# Patient Record
Sex: Male | Born: 1935 | Race: Black or African American | Hispanic: No | Marital: Married | Smoking: Former smoker
Health system: Southern US, Community
[De-identification: ages and names within clinical notes are randomized; demographics above are authoritative.]

## PROBLEM LIST (undated history)

## (undated) DIAGNOSIS — I10 Essential (primary) hypertension: Secondary | ICD-10-CM

## (undated) DIAGNOSIS — I442 Atrioventricular block, complete: Secondary | ICD-10-CM

## (undated) HISTORY — DX: Atrioventricular block, complete: I44.2

## (undated) HISTORY — PX: FRACTURE SURGERY: SHX138

---

## 2009-10-10 ENCOUNTER — Ambulatory Visit: Payer: Self-pay | Admitting: Diagnostic Radiology

## 2009-10-10 ENCOUNTER — Emergency Department (HOSPITAL_BASED_OUTPATIENT_CLINIC_OR_DEPARTMENT_OTHER): Admission: EM | Admit: 2009-10-10 | Discharge: 2009-10-10 | Payer: Self-pay | Admitting: Emergency Medicine

## 2010-09-28 LAB — URINALYSIS, ROUTINE W REFLEX MICROSCOPIC
Glucose, UA: NEGATIVE mg/dL
Nitrite: NEGATIVE
Protein, ur: 100 mg/dL — AB
pH: 5 (ref 5.0–8.0)

## 2010-09-28 LAB — DIFFERENTIAL
Basophils Relative: 0 % (ref 0–1)
Eosinophils Relative: 0 % (ref 0–5)
Lymphocytes Relative: 7 % — ABNORMAL LOW (ref 12–46)
Lymphs Abs: 0.4 10*3/uL — ABNORMAL LOW (ref 0.7–4.0)
Monocytes Relative: 4 % (ref 3–12)
Neutro Abs: 6.1 10*3/uL (ref 1.7–7.7)
Neutrophils Relative %: 89 % — ABNORMAL HIGH (ref 43–77)

## 2010-09-28 LAB — URINE MICROSCOPIC-ADD ON

## 2010-09-28 LAB — COMPREHENSIVE METABOLIC PANEL
ALT: 44 U/L (ref 0–53)
Alkaline Phosphatase: 115 U/L (ref 39–117)
CO2: 26 mEq/L (ref 19–32)
GFR calc Af Amer: 25 mL/min — ABNORMAL LOW (ref >60)
GFR calc non Af Amer: 21 mL/min — ABNORMAL LOW (ref >60)
Potassium: 3.9 mEq/L (ref 3.5–5.1)
Total Protein: 7.4 g/dL (ref 6.0–8.3)

## 2010-09-28 LAB — PROTIME-INR: Prothrombin Time: 14.7 seconds (ref 11.6–15.2)

## 2010-09-28 LAB — POCT I-STAT 3, ART BLOOD GAS (G3+)
Bicarbonate: 23.8 mEq/L (ref 20.0–24.0)
TCO2: 25 mmol/L (ref 0–100)

## 2010-09-28 LAB — URINE CULTURE: Colony Count: 40000

## 2010-09-28 LAB — CBC
HCT: 33.4 % — ABNORMAL LOW (ref 39.0–52.0)
Hemoglobin: 11.3 g/dL — ABNORMAL LOW (ref 13.0–17.0)
MCV: 88.8 fL (ref 78.0–100.0)
WBC: 6.7 10*3/uL (ref 4.0–10.5)

## 2010-09-28 LAB — CULTURE, BLOOD (ROUTINE X 2)

## 2010-09-28 LAB — POCT CARDIAC MARKERS

## 2010-09-28 LAB — D-DIMER, QUANTITATIVE: D-Dimer, Quant: 2.26 ug/mL-FEU — ABNORMAL HIGH (ref 0.00–0.48)

## 2018-04-15 ENCOUNTER — Inpatient Hospital Stay (HOSPITAL_BASED_OUTPATIENT_CLINIC_OR_DEPARTMENT_OTHER)
Admission: EM | Admit: 2018-04-15 | Discharge: 2018-04-17 | DRG: 982 | Disposition: A | Discharge status: Home / Self Care | Payer: Medicare Other | Attending: Cardiology | Admitting: Cardiology

## 2018-04-15 ENCOUNTER — Other Ambulatory Visit: Payer: Self-pay

## 2018-04-15 ENCOUNTER — Encounter (HOSPITAL_BASED_OUTPATIENT_CLINIC_OR_DEPARTMENT_OTHER): Payer: Self-pay | Admitting: *Deleted

## 2018-04-15 ENCOUNTER — Emergency Department (HOSPITAL_BASED_OUTPATIENT_CLINIC_OR_DEPARTMENT_OTHER): Payer: Medicare Other

## 2018-04-15 DIAGNOSIS — R079 Chest pain, unspecified: Secondary | ICD-10-CM | POA: Diagnosis present

## 2018-04-15 DIAGNOSIS — Z6831 Body mass index (BMI) 31.0-31.9, adult: Secondary | ICD-10-CM

## 2018-04-15 DIAGNOSIS — E1122 Type 2 diabetes mellitus with diabetic chronic kidney disease: Secondary | ICD-10-CM | POA: Diagnosis present

## 2018-04-15 DIAGNOSIS — Z23 Encounter for immunization: Secondary | ICD-10-CM

## 2018-04-15 DIAGNOSIS — I442 Atrioventricular block, complete: Secondary | ICD-10-CM | POA: Diagnosis present

## 2018-04-15 DIAGNOSIS — E669 Obesity, unspecified: Secondary | ICD-10-CM | POA: Diagnosis present

## 2018-04-15 DIAGNOSIS — M109 Gout, unspecified: Secondary | ICD-10-CM | POA: Diagnosis present

## 2018-04-15 DIAGNOSIS — N183 Chronic kidney disease, stage 3 (moderate): Secondary | ICD-10-CM | POA: Diagnosis present

## 2018-04-15 DIAGNOSIS — E1151 Type 2 diabetes mellitus with diabetic peripheral angiopathy without gangrene: Secondary | ICD-10-CM | POA: Diagnosis present

## 2018-04-15 DIAGNOSIS — I340 Nonrheumatic mitral (valve) insufficiency: Secondary | ICD-10-CM | POA: Diagnosis present

## 2018-04-15 DIAGNOSIS — I10 Essential (primary) hypertension: Secondary | ICD-10-CM

## 2018-04-15 DIAGNOSIS — E785 Hyperlipidemia, unspecified: Secondary | ICD-10-CM | POA: Diagnosis present

## 2018-04-15 DIAGNOSIS — M1990 Unspecified osteoarthritis, unspecified site: Secondary | ICD-10-CM | POA: Diagnosis present

## 2018-04-15 DIAGNOSIS — I4510 Unspecified right bundle-branch block: Secondary | ICD-10-CM | POA: Diagnosis present

## 2018-04-15 DIAGNOSIS — Z959 Presence of cardiac and vascular implant and graft, unspecified: Secondary | ICD-10-CM

## 2018-04-15 DIAGNOSIS — I129 Hypertensive chronic kidney disease with stage 1 through stage 4 chronic kidney disease, or unspecified chronic kidney disease: Secondary | ICD-10-CM | POA: Diagnosis present

## 2018-04-15 DIAGNOSIS — Z7982 Long term (current) use of aspirin: Secondary | ICD-10-CM

## 2018-04-15 HISTORY — DX: Essential (primary) hypertension: I10

## 2018-04-15 LAB — TROPONIN I: Troponin I: <0.03 ng/mL (ref <0.03)

## 2018-04-15 LAB — CBC WITH DIFFERENTIAL/PLATELET
BASOS PCT: 1 %
Basophils Absolute: 0 10*3/uL (ref 0.0–0.1)
Eosinophils Absolute: 0.4 10*3/uL (ref 0.0–0.7)
Eosinophils Relative: 8 %
HEMATOCRIT: 37.3 % — AB (ref 39.0–52.0)
HEMOGLOBIN: 12.6 g/dL — AB (ref 13.0–17.0)
Lymphocytes Relative: 36 %
Lymphs Abs: 1.9 10*3/uL (ref 0.7–4.0)
MCH: 29.6 pg (ref 26.0–34.0)
MCHC: 33.8 g/dL (ref 30.0–36.0)
MCV: 87.6 fL (ref 78.0–100.0)
MONOS PCT: 10 %
Monocytes Absolute: 0.5 10*3/uL (ref 0.1–1.0)
NEUTROS ABS: 2.4 10*3/uL (ref 1.7–7.7)
NEUTROS PCT: 45 %
Platelets: 246 10*3/uL (ref 150–400)
RBC: 4.26 MIL/uL (ref 4.22–5.81)
RDW: 14.3 % (ref 11.5–15.5)
WBC: 5.4 10*3/uL (ref 4.0–10.5)

## 2018-04-15 LAB — CBC
HCT: 36.9 % — ABNORMAL LOW (ref 39.0–52.0)
Hemoglobin: 12.1 g/dL — ABNORMAL LOW (ref 13.0–17.0)
MCH: 29.7 pg (ref 26.0–34.0)
MCHC: 32.8 g/dL (ref 30.0–36.0)
MCV: 90.7 fL (ref 78.0–100.0)
PLATELETS: 228 10*3/uL (ref 150–400)
RBC: 4.07 MIL/uL — AB (ref 4.22–5.81)
RDW: 13.9 % (ref 11.5–15.5)
WBC: 5.4 10*3/uL (ref 4.0–10.5)

## 2018-04-15 LAB — COMPREHENSIVE METABOLIC PANEL
ALBUMIN: 4 g/dL (ref 3.5–5.0)
ALT: 21 U/L (ref 0–44)
AST: 29 U/L (ref 15–41)
Alkaline Phosphatase: 56 U/L (ref 38–126)
Anion gap: 8 (ref 5–15)
BILIRUBIN TOTAL: 0.7 mg/dL (ref 0.3–1.2)
BUN: 21 mg/dL (ref 8–23)
CO2: 25 mmol/L (ref 22–32)
Calcium: 9.3 mg/dL (ref 8.9–10.3)
Chloride: 104 mmol/L (ref 98–111)
Creatinine, Ser: 1.62 mg/dL — ABNORMAL HIGH (ref 0.61–1.24)
GFR calc Af Amer: 44 mL/min — ABNORMAL LOW (ref >60)
GFR calc non Af Amer: 38 mL/min — ABNORMAL LOW (ref >60)
GLUCOSE: 109 mg/dL — AB (ref 70–99)
Potassium: 3.8 mmol/L (ref 3.5–5.1)
Sodium: 137 mmol/L (ref 135–145)
TOTAL PROTEIN: 7.8 g/dL (ref 6.5–8.1)

## 2018-04-15 LAB — MAGNESIUM: Magnesium: 1.7 mg/dL (ref 1.7–2.4)

## 2018-04-15 MED ORDER — HYDRALAZINE HCL 20 MG/ML IJ SOLN: 10.0000 mg | Freq: Four times a day (QID) | INTRAMUSCULAR | Status: DC | PRN

## 2018-04-15 MED ORDER — HEPARIN SODIUM (PORCINE) 5000 UNIT/ML IJ SOLN
5000.0000 [IU] | Freq: Three times a day (TID) | INTRAMUSCULAR | Status: DC
  Administered 2018-04-15 – 2018-04-16 (×2): 5000 [IU] via SUBCUTANEOUS
  Filled 2018-04-15 (×2): qty 1

## 2018-04-15 MED ORDER — AMLODIPINE BESYLATE 5 MG PO TABS
5.0000 mg | ORAL_TABLET | Freq: Every day | ORAL | Status: DC
  Administered 2018-04-15: 5 mg via ORAL
  Filled 2018-04-15: qty 1

## 2018-04-15 MED ORDER — ASPIRIN 81 MG PO CHEW
81.0000 mg | CHEWABLE_TABLET | Freq: Every day | ORAL | Status: DC
  Administered 2018-04-16: 81 mg via ORAL
  Filled 2018-04-15: qty 1

## 2018-04-15 MED ORDER — INFLUENZA VAC SPLIT HIGH-DOSE 0.5 ML IM SUSY
0.5000 mL | PREFILLED_SYRINGE | INTRAMUSCULAR | Status: AC
  Administered 2018-04-16: 0.5 mL via INTRAMUSCULAR
  Filled 2018-04-15: qty 0.5

## 2018-04-15 NOTE — ED Provider Notes (Signed)
[facility] EMERGENCY DEPARTMENT Provider Note   CSN: [id number] Arrival date & time: 04/15/18  1455     History   Chief Complaint Chief Complaint  Patient presents with  . Chest Pain    HPI Alexander Oconnor is a 84 y.o. male.  Patient is an 84 year old male with hx of HTN and is currently wearing a heart monitor because he states his doctor thought he may have an abnormal rhythm.  Patient states he has had a normal day today.  He was outside working in the yard when approximately 20 minutes ago he was called by his doctor's office at [facility] cardiology and told to go to the hospital immediately because the monitor was showing an abnormal heart rhythm and concern for blockage.  Patient has no complaints at this time.  He does not feel weak, near syncopal, chest pain, shortness of breath, tired, abdominal pain, nausea or vomiting.  He states is been a normal day.  He is currently taking his medications as prescribed.  He takes aspirin but no other anticoagulant.  He did recently stop a medication on Friday when he had the monitor placed but that is the only recent medication change.  He cannot remember the name of the medication.  The history is provided by the patient and the spouse.    Past Medical History:  Diagnosis Date  . Hypertension     There are no active problems to display for this patient.   History reviewed. No pertinent surgical history.      Home Medications    Prior to Admission medications   Medication Sig Start Date End Date Taking? Authorizing Provider  aspirin 81 MG tablet Take 81 mg by mouth daily.   Yes [provider]  Carvedilol (COREG PO) Take by mouth.   Yes [provider]  MELOXICAM PO Take by mouth.   Yes [provider]  naproxen sodium (ALEVE) 220 MG tablet Take 220 mg by mouth.   Yes [provider]  SPIRONOLACTONE PO Take by mouth.   Yes [provider]    Family History No family  history on file.  Social History Social History   Tobacco Use  . Smoking status: Not on file  Substance Use Topics  . Alcohol use: Not on file  . Drug use: Not on file     Allergies   Patient has no known allergies.   Review of Systems Review of Systems  All other systems reviewed and are negative.    Physical Exam Updated Vital Signs BP (!) 188/64   Pulse 65   Temp 97.8 F (36.6 C) (Oral)   Resp 20   Ht 5\' 11"  (1.803 m)   Wt 79.4 kg   SpO2 100%   BMI 24.41 kg/m   Physical Exam  Constitutional: He is oriented to person, place, and time. He appears well-developed and well-nourished. No distress.  HENT:  Head: Normocephalic and atraumatic.  Mouth/Throat: Oropharynx is clear and moist.  Eyes: Pupils are equal, round, and reactive to light. Conjunctivae and EOM are normal.  Neck: Normal range of motion. Neck supple.  Cardiovascular: Regular rhythm and intact distal pulses. Bradycardia present.  No murmur heard. Pulmonary/Chest: Effort normal and breath sounds normal. No respiratory distress. He has no wheezes. He has no rales.  Abdominal: Soft. He exhibits no distension. There is no tenderness. There is no rebound and no guarding.  Musculoskeletal: Normal range of motion. He exhibits no edema or tenderness.  Neurological: He is alert and oriented to person, place, and time.  Skin: Skin is warm and dry. No rash noted. No erythema.  Psychiatric: He has a normal mood and affect. His behavior is normal.  Nursing note and vitals reviewed.    ED Treatments / Results  Labs (all labs ordered are listed, but only abnormal results are displayed) Labs Reviewed  CBC WITH DIFFERENTIAL/PLATELET - Abnormal; Notable for the following components:      Result Value   Hemoglobin 12.6 (*)    HCT 37.3 (*)    All other components within normal limits  COMPREHENSIVE METABOLIC PANEL - Abnormal; Notable for the following components:   Glucose, Bld 109 (*)    Creatinine, Ser 1.62  (*)    GFR calc non Af Amer 38 (*)    GFR calc Af Amer 44 (*)    All other components within normal limits  TROPONIN I  MAGNESIUM    EKG EKG Interpretation  Date/Time:  Monday April 15 2018 15:20:32 EDT Ventricular Rate:  41 PR Interval:    QRS Duration: 152 QT Interval:  509 QTC Calculation: 421 R Axis:   85 Text Interpretation:  new  Third degree heart block Right bundle branch block LVH by voltage Inferior infarct, old Lateral leads are also involved Confirmed by Gwyneth Sprout ([id number]) on 04/15/2018 3:58:10 PM   Radiology Dg Chest 2 View  Result Date: 04/15/2018 CLINICAL DATA:  Chest pain EXAM: CHEST - 2 VIEW COMPARISON:  Chest x-ray of 4 levin 2011 FINDINGS: No active infiltrate or effusion is seen. There is slight elevation of both hemidiaphragms. Mild atelectasis is noted at the lung bases as result. Mediastinal and hilar contours are stable and moderate cardiomegaly is stable. No acute bony abnormality is seen. IMPRESSION: Stable chest x-ray with no active lung disease. Stable moderate cardiomegaly. Electronically Signed   By: Dwyane Dee M.D.   On: 04/15/2018 16:07    Procedures Procedures (including critical care time)  Medications Ordered in ED Medications - No data to display   Initial Impression / Assessment and Plan / ED Course  I have reviewed the triage vital signs and the nursing notes.  Pertinent labs & imaging results that were available during my care of the patient were reviewed by me and considered in my medical decision making (see chart for details).     84 year old male coming in today because he was called by his cardiology office stating that he needed to come to the hospital immediately.  Patient states that he had an heart monitor placed on Friday because he felt like he was having an irregular heartbeat.  On Friday he did have an episode and he hit the button and he has not had any episodes since that time.  Today he was working in his yard  and is felt completely normal.  However when speaking with the cardiology office they reviewed his strip today from his event on Friday and it showed a possible complete heart block and they recommended he come for further evaluation.  3:59 PM His EKG today is concerning for third-degree heart block with a heart rate most consistently in the 40s.  Spoke with Dr. Hanley Hays who requested patient go to [facility] for pacemaker.  Labs are pending  4:25 PM Labs without acute findings and CXR with cardiomegaly but nothing acute.  CRITICAL CARE Performed by: Kenyon Eichelberger Total critical care time: 30 minutes Critical care time was exclusive of separately billable procedures and treating other patients.  Critical care was necessary to treat or prevent imminent or life-threatening deterioration. Critical care was time spent personally by me on the following activities: development of treatment plan with patient and/or surrogate as well as nursing, discussions with consultants, evaluation of patient's response to treatment, examination of patient, obtaining history from patient or surrogate, ordering and performing treatments and interventions, ordering and review of laboratory studies, ordering and review of radiographic studies, pulse oximetry and re-evaluation of patient's condition.   Final Clinical Impressions(s) / ED Diagnoses   Final diagnoses:  Third degree heart block [facility])    ED Discharge Orders    None       Gwyneth Sprout, MD 04/15/18 1642

## 2018-04-15 NOTE — H&P (Signed)
Cardiology Admission History and Physical:   Patient ID: Alexander Oconnor MRN: [id number]; DOB: Dec 16, 1933   Admission date: 04/15/2018  Primary Care Provider: Patient, No Pcp Per Primary Cardiologist: New to [facility] Primary Electrophysiologist:  None   Chief Complaint:  arrhythmia  Patient Profile:   Alexander Oconnor is a 84 y.o. male with PMH of HTN, carotid artery disease, PVD, and mitral regurgitation, who presented to [facility] after being instructed to present to the ED for an arrhythmia on his heart monitor.  History of Present Illness:   Alexander Oconnor was in his usual state of health this afternoon, working in his yard, when he received a call from his cardiology office at [facility] to notify him of an abnormal heart rhythm on his event monitor and to present to the ED immediately for further evaluation. He was recently seen outpatient 03/15/18 and his EKG reportedly revealed AVB with 2:1 conduction. His labetalol was decreased at that time and he was recommended for a 30d event monitor. He has not had any complaints of chest pain, weakness, dizziness, lightheadedness, syncope, SOB, nausea, vomiting, or fatigue.    ED course: HR down to the 30s, BP significantly elevated to 205/80, otherwise VSS. Labs notable for electrolytes wnl, Cr 1.62, Hgb 12.6, PLT 246, troponin negative. CXR without acute findings. EKG on arrival to [facility] revealed complete heart block. Patient was transferred to [facility] for EP evaluation and likely PPM placement.    Past Medical History:  Diagnosis Date  . Hypertension     History reviewed. No pertinent surgical history.   Medications Prior to Admission: Prior to Admission medications   Medication Sig Start Date End Date Taking? Authorizing Provider  aspirin 81 MG tablet Take 81 mg by mouth daily.   Yes [provider]  Carvedilol (COREG PO) Take by mouth.   Yes [provider]  MELOXICAM PO Take by mouth.   Yes [provider]  naproxen sodium (ALEVE) 220 MG tablet Take 220 mg by mouth.   Yes [provider]  SPIRONOLACTONE PO Take by mouth.   Yes [provider]     Allergies:   No Known Allergies  Social History:   Social History   Socioeconomic History  . Marital status: Married    Spouse name: Not on file  . Number of children: Not on file  . Years of education: Not on file  . Highest education level: Not on file  Occupational History  . Not on file  Social Needs  . Financial resource strain: Not on file  . Food insecurity:    Worry: Not on file    Inability: Not on file  . Transportation needs:    Medical: Not on file    Non-medical: Not on file  Tobacco Use  . Smoking status: Not on file  Substance and Sexual Activity  . Alcohol use: Not on file  . Drug use: Not on file  . Sexual activity: Not on file  Lifestyle  . Physical activity:    Days per week: Not on file    Minutes per session: Not on file  . Stress: Not on file  Relationships  . Social connections:    Talks on phone: Not on file    Gets together: Not on file    Attends religious service: Not on file    Active member of club or organization: Not on file    Attends meetings of clubs or organizations: Not on file  Relationship status: Not on file  . Intimate partner violence:    Fear of current or ex partner: Not on file    Emotionally abused: Not on file    Physically abused: Not on file    Forced sexual activity: Not on file  Other Topics Concern  . Not on file  Social History Narrative  . Not on file    Family History:  Reports his parents passed away from old age. Denies family history of heart problems.  The patient's family history is negative for Heart disease.    ROS:  Please see the history of present illness.  All other ROS reviewed and negative.     Physical Exam/Data:   Vitals:   04/15/18 1530 04/15/18 1706 04/15/18 1921 04/15/18 1947  BP: (!) 181/69 (!) 178/80 (!) 205/80     Pulse: (!) 42 (!) 36 (!) 37 73  Resp: 14 18 19 15   Temp:   97.7 F (36.5 C)   TempSrc:   Oral   SpO2: 100% 99% 100% 99%  Weight:   101.6 kg   Height:       No intake or output data in the 24 hours ending 04/15/18 2049 Filed Weights   04/15/18 1502 04/15/18 1921  Weight: 79.4 kg 101.6 kg   Body mass index is 31.23 kg/m.  General:  Well nourished, well developed, laying in bed in no acute distress HEENT: sclera anicteric Neck: no JVD Endocrine:  No thryomegaly Vascular: No carotid bruits; distal pulses 2+ bilaterally Cardiac:  normal S1, S2; RRR; soft murmur, no rubs/gallops  Lungs:  Decreased breath sounds throughout, no wheezes/rales/rhonchi Abd: soft, obese, nontender, no hepatomegaly  Ext: no edema Musculoskeletal:  No deformities, BUE and BLE strength normal and equal Skin: warm and dry  Neuro:  CNs 2-12 intact, no focal abnormalities noted Psych:  Normal affect    EKG:  EKG on arrival to [facility] revealed complete heart block with RBBB (chronic)  Relevant CV Studies: None available for review. Last echo reportedly 03/15/18 "stable" per cardiologist.   Laboratory Data:  Chemistry Recent Labs  Lab 04/15/18 1535  NA 137  K 3.8  CL 104  CO2 25  GLUCOSE 109*  BUN 21  CREATININE 1.62*  CALCIUM 9.3  GFRNONAA 38*  GFRAA 44*  ANIONGAP 8    Recent Labs  Lab 04/15/18 1535  PROT 7.8  ALBUMIN 4.0  AST 29  ALT 21  ALKPHOS 56  BILITOT 0.7   Hematology Recent Labs  Lab 04/15/18 1535  WBC 5.4  RBC 4.26  HGB 12.6*  HCT 37.3*  MCV 87.6  MCH 29.6  MCHC 33.8  RDW 14.3  PLT 246   Cardiac Enzymes Recent Labs  Lab 04/15/18 1535  TROPONINI <0.03   No results for input(s): TROPIPOC in the last 168 hours.  BNPNo results for input(s): BNP, PROBNP in the last 168 hours.  DDimer No results for input(s): DDIMER in the last 168 hours.  Radiology/Studies:  Dg Chest 2 View  Result Date: 04/15/2018 CLINICAL DATA:  Chest pain EXAM: CHEST - 2 VIEW COMPARISON:   Chest x-ray of 4 levin 2011 FINDINGS: No active infiltrate or effusion is seen. There is slight elevation of both hemidiaphragms. Mild atelectasis is noted at the lung bases as result. Mediastinal and hilar contours are stable and moderate cardiomegaly is stable. No acute bony abnormality is seen. IMPRESSION: Stable chest x-ray with no active lung disease. Stable moderate cardiomegaly. Electronically Signed   By: Dwyane Dee  M.D.   On: 04/15/2018 16:07    Assessment and Plan:   1. CHB: noted on event monitor outpatient. He was instructed to present to the ED for further evaluation. He is asymptomatic. HR in the 30s. EKG shows complete heart block. BP markedly elevated. Last dose of carvedilol was this morning.  - Will hold carvedilol at this time.  - Will check an echocardiogram - Will check a TSH - EP to see in AM - Continue to monitor closely on telemetry and maintain pacer pads as a precaution.   2. HTN: BP poorly controlled.  - Will start amlodipine 5mg  daily - Continue spironolactone once dose confirmed by pharmacy  3. CKD stage 3: Cr 1.6 on admission; presumably this is his baseline. Per cardiology note, he follows with a nephrologist - Continue close monitoring of Cr  4. Carotid artery disease: follows with his outpatient cardiologist - Recommended for annual monitoring.   5. Mitral regurgitation: asymptomatic. Followed with routine outpatient echocardiograms - Continue routine outpatient monitoring.    Severity of Illness: The appropriate patient status for this patient is INPATIENT. Inpatient status is judged to be reasonable and necessary in order to provide the required intensity of service to ensure the patient's safety. The patient's presenting symptoms, physical exam findings, and initial radiographic and laboratory data in the context of their chronic comorbidities is felt to place them at high risk for further clinical deterioration. Furthermore, it is not anticipated that  the patient will be medically stable for discharge from the hospital within 2 midnights of admission. The following factors support the patient status of inpatient.   " The patient's presenting symptoms include asymptomatic. " The worrisome physical exam findings include benign cardiopulmonary exam. " The initial radiographic and laboratory data are worrisome because of CHB on EKG. " The chronic co-morbidities include HTN, carotid artery disease, CKD stage 3.   * I certify that at the point of admission it is my clinical judgment that the patient will require inpatient hospital care spanning beyond 2 midnights from the point of admission due to high intensity of service, high risk for further deterioration and high frequency of surveillance required.*    For questions or updates, please contact [facility] Please consult [email or web address] for contact info under        Signed, Beatriz Stallion, PA-C  04/15/2018 8:49 PM

## 2018-04-15 NOTE — ED Notes (Signed)
Called Dr. Hanley Hays at [facility]

## 2018-04-15 NOTE — Progress Notes (Signed)
Skin assessment done by this nurse and Laney Pastor. On head to toe assessment no pressure ulcer noted. Patient came in with personal heart monitor which was given to his wife to take home.

## 2018-04-15 NOTE — ED Triage Notes (Signed)
He is wearing a heart monitor. The office called him at home and told him to come to the ED. Wife thinks they told her he had a blockage. Pt is pain free.

## 2018-04-15 NOTE — ED Notes (Signed)
Elevated SBP (216)  addressed with EDP.  No new order at this time.  Emergency planning/management officer, Tray, is made aware that I had a conversation with EDP.  PAtient is en route to [facility] via [employer].  Wife at bedside during this transfer.

## 2018-04-16 ENCOUNTER — Encounter (HOSPITAL_COMMUNITY)
Admission: EM | Disposition: A | Discharge status: Home / Self Care | Payer: Self-pay | Source: Home / Self Care | Attending: Cardiology

## 2018-04-16 ENCOUNTER — Inpatient Hospital Stay (HOSPITAL_COMMUNITY): Payer: Medicare Other

## 2018-04-16 DIAGNOSIS — I340 Nonrheumatic mitral (valve) insufficiency: Secondary | ICD-10-CM

## 2018-04-16 DIAGNOSIS — I442 Atrioventricular block, complete: Secondary | ICD-10-CM

## 2018-04-16 HISTORY — PX: PACEMAKER IMPLANT: EP1218

## 2018-04-16 LAB — BASIC METABOLIC PANEL
Anion gap: 5 (ref 5–15)
BUN: 17 mg/dL (ref 8–23)
CO2: 26 mmol/L (ref 22–32)
Calcium: 9.1 mg/dL (ref 8.9–10.3)
Chloride: 105 mmol/L (ref 98–111)
Creatinine, Ser: 1.57 mg/dL — ABNORMAL HIGH (ref 0.61–1.24)
GFR calc Af Amer: 46 mL/min — ABNORMAL LOW (ref >60)
GFR calc non Af Amer: 40 mL/min — ABNORMAL LOW (ref >60)
Glucose, Bld: 104 mg/dL — ABNORMAL HIGH (ref 70–99)
Potassium: 3.9 mmol/L (ref 3.5–5.1)
Sodium: 136 mmol/L (ref 135–145)

## 2018-04-16 LAB — ECHOCARDIOGRAM COMPLETE
HEIGHTINCHES: 71 in
Weight: 3582.4 oz

## 2018-04-16 LAB — SURGICAL PCR SCREEN
MRSA, PCR: NEGATIVE
Staphylococcus aureus: NEGATIVE

## 2018-04-16 LAB — TSH: TSH: 1.862 u[IU]/mL (ref 0.350–4.500)

## 2018-04-16 SURGERY — PACEMAKER IMPLANT
Anesthesia: LOCAL

## 2018-04-16 MED ORDER — CEFAZOLIN SODIUM-DEXTROSE 1-4 GM/50ML-% IV SOLN
1.0000 g | Freq: Four times a day (QID) | INTRAVENOUS | Status: AC
  Administered 2018-04-16 – 2018-04-17 (×3): 1 g via INTRAVENOUS
  Filled 2018-04-16 (×3): qty 50

## 2018-04-16 MED ORDER — ONDANSETRON HCL 4 MG/2ML IJ SOLN: 4.0000 mg | Freq: Four times a day (QID) | INTRAMUSCULAR | Status: DC | PRN

## 2018-04-16 MED ORDER — IOPAMIDOL (ISOVUE-370) INJECTION 76%
INTRAVENOUS | Status: DC | PRN
  Administered 2018-04-16: 15 mL via INTRAVENOUS

## 2018-04-16 MED ORDER — CEFAZOLIN SODIUM-DEXTROSE 2-4 GM/100ML-% IV SOLN
INTRAVENOUS | Status: AC
  Filled 2018-04-16: qty 100

## 2018-04-16 MED ORDER — CEFAZOLIN SODIUM-DEXTROSE 2-4 GM/100ML-% IV SOLN
2.0000 g | INTRAVENOUS | Status: AC
  Administered 2018-04-16: 2 g via INTRAVENOUS
  Filled 2018-04-16: qty 100

## 2018-04-16 MED ORDER — SODIUM CHLORIDE 0.9% FLUSH: 3.0000 mL | INTRAVENOUS | Status: DC | PRN

## 2018-04-16 MED ORDER — SODIUM CHLORIDE 0.9 % IV SOLN: 250.0000 mL | INTRAVENOUS | Status: DC | PRN

## 2018-04-16 MED ORDER — SODIUM CHLORIDE 0.9 % IV SOLN
80.0000 mg | INTRAVENOUS | Status: AC
  Administered 2018-04-16: 80 mg
  Filled 2018-04-16: qty 2

## 2018-04-16 MED ORDER — CHLORHEXIDINE GLUCONATE 4 % EX LIQD
60.0000 mL | Freq: Once | CUTANEOUS | Status: AC
  Administered 2018-04-16: 4 via TOPICAL

## 2018-04-16 MED ORDER — IOPAMIDOL (ISOVUE-370) INJECTION 76%
INTRAVENOUS | Status: AC
  Filled 2018-04-16: qty 50

## 2018-04-16 MED ORDER — SODIUM CHLORIDE 0.9% FLUSH
3.0000 mL | Freq: Two times a day (BID) | INTRAVENOUS | Status: DC
  Administered 2018-04-16: 3 mL via INTRAVENOUS

## 2018-04-16 MED ORDER — HEPARIN (PORCINE) IN NACL 1000-0.9 UT/500ML-% IV SOLN
INTRAVENOUS | Status: AC
  Filled 2018-04-16: qty 500

## 2018-04-16 MED ORDER — ACETAMINOPHEN 325 MG PO TABS: 325.0000 mg | ORAL_TABLET | ORAL | Status: DC | PRN

## 2018-04-16 MED ORDER — LIDOCAINE HCL (PF) 1 % IJ SOLN
INTRAMUSCULAR | Status: DC | PRN
  Administered 2018-04-16: 45 mL

## 2018-04-16 MED ORDER — SODIUM CHLORIDE 0.9 % IV SOLN
INTRAVENOUS | Status: DC
  Administered 2018-04-16: 13:00:00 via INTRAVENOUS

## 2018-04-16 MED ORDER — SODIUM CHLORIDE 0.9 % IV SOLN
INTRAVENOUS | Status: AC
  Filled 2018-04-16: qty 2

## 2018-04-16 MED ORDER — HYDRALAZINE HCL 20 MG/ML IJ SOLN
INTRAMUSCULAR | Status: AC
  Filled 2018-04-16: qty 1

## 2018-04-16 MED ORDER — FINASTERIDE 5 MG PO TABS
5.0000 mg | ORAL_TABLET | Freq: Every day | ORAL | Status: DC
  Administered 2018-04-16 – 2018-04-17 (×2): 5 mg via ORAL
  Filled 2018-04-16 (×2): qty 1

## 2018-04-16 MED ORDER — SPIRONOLACTONE 25 MG PO TABS
50.0000 mg | ORAL_TABLET | Freq: Every day | ORAL | Status: DC
  Administered 2018-04-16: 50 mg via ORAL
  Filled 2018-04-16: qty 2

## 2018-04-16 MED ORDER — CHLORHEXIDINE GLUCONATE 4 % EX LIQD
60.0000 mL | Freq: Once | CUTANEOUS | Status: DC
  Filled 2018-04-16: qty 60

## 2018-04-16 MED ORDER — HYDROCODONE-ACETAMINOPHEN 5-325 MG PO TABS
1.0000 | ORAL_TABLET | ORAL | Status: DC | PRN
  Administered 2018-04-16: 2 via ORAL
  Filled 2018-04-16: qty 2

## 2018-04-16 MED ORDER — HEPARIN (PORCINE) IN NACL 1000-0.9 UT/500ML-% IV SOLN
INTRAVENOUS | Status: DC | PRN
  Administered 2018-04-16: 500 mL

## 2018-04-16 MED ORDER — LIDOCAINE HCL 1 % IJ SOLN
INTRAMUSCULAR | Status: AC
  Filled 2018-04-16: qty 60

## 2018-04-16 MED ORDER — SODIUM CHLORIDE 0.9 % IV SOLN: 250.0000 mL | INTRAVENOUS | Status: DC

## 2018-04-16 MED ORDER — HYDRALAZINE HCL 20 MG/ML IJ SOLN
INTRAMUSCULAR | Status: DC | PRN
  Administered 2018-04-16: 10 mg via INTRAVENOUS

## 2018-04-16 MED ORDER — LABETALOL HCL 100 MG PO TABS
100.0000 mg | ORAL_TABLET | Freq: Two times a day (BID) | ORAL | Status: DC
  Administered 2018-04-16 – 2018-04-17 (×2): 100 mg via ORAL
  Filled 2018-04-16 (×2): qty 1

## 2018-04-16 SURGICAL SUPPLY — 7 items
CABLE SURGICAL S-101-97-12 (CABLE) ×2 IMPLANT
LEAD TENDRIL MRI 52CM LPA1200M (Lead) ×2 IMPLANT
LEAD TENDRIL MRI 58CM LPA1200M (Lead) ×2 IMPLANT
PACEMAKER ASSURITY DR-RF (Pacemaker) ×2 IMPLANT
PAD PRO RADIOLUCENT 2001M-C (PAD) ×2 IMPLANT
SHEATH CLASSIC 8F (SHEATH) ×4 IMPLANT
TRAY PACEMAKER INSERTION (PACKS) ×2 IMPLANT

## 2018-04-16 NOTE — Progress Notes (Signed)
  Echocardiogram 2D Echocardiogram has been performed.  Alexander Oconnor 04/16/2018, 11:07 AM

## 2018-04-16 NOTE — Interval H&P Note (Signed)
History and Physical Interval Note:  04/16/2018 1:48 PM  Alexander Oconnor  has presented today for surgery, with the diagnosis of Complete Heart Block  The various methods of treatment have been discussed with the patient and family. After consideration of risks, benefits and other options for treatment, the patient has consented to  Procedure(s): PACEMAKER IMPLANT (N/A) as a surgical intervention .  The patient's history has been reviewed, patient examined, no change in status, stable for surgery.  I have reviewed the patient's chart and labs.  Questions were answered to the patient's satisfaction.     Hillis Range

## 2018-04-16 NOTE — Consult Note (Addendum)
Cardiology Consultation:   Patient ID: Alexander Oconnor MRN: [id number]; DOB: [birth year]  Admit date: 04/15/2018 Date of Consult: 04/16/2018  Primary Care Provider: Patient, No Pcp Per Primary Cardiologist: Dr. Hanley Hays [facility] cardiology) Primary Electrophysiologist:  None    Patient Profile:   Alexander Oconnor is a 84 y.o. 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 Mayford Knife.  History of Present Illness:   Alexander Oconnor was seen by his cardiologist at [facility], he went for his test results (Echo, carotid US, LEA/ABI study).  Reports of those studies are not included in record/note, though mention echo was "stable", recs follow up studies 1 year for all.  It appears that day 03/15/18, he was found in 2:1 AVBlock, w/RBBB, 68bpm and asymptomatic.  Labetalol was reduced to 100mg  BID (from 200mg  BID) and planned for monitoring and f/u.  He was referred to the ER yesterday by that office for telemetry reports of CHB.  He was seen at [facility], noted to be in advanced heart block, hypertensive 205/80 and transferred to [facility] last evening for further care and management.  LABS today K+ 3.9 BUN/Creat 17/1.57 Mag (yesterday) 1.7 WBC 5.4 H/H 12/36 Plts 228  Patient confirms he was on labetalol, his dose reduced at his visit with his doctor last month, and this past weekend was called by his office Saturday 04/13/18 to stop it because his heart rate was still too slow.  His last dose of labetalol was 04/13/18 morning.  He feels well, states he was working in his yard all weekend, no CP, palpitations, no exertional intolerances.  No dizziness, near syncope or syncope are reported.   Past Medical History:  Diagnosis Date  . Hypertension     History reviewed. No pertinent surgical history.    Inpatient Medications: Scheduled Meds: . aspirin  81 mg Oral Daily  .  heparin  5,000 Units Subcutaneous Q8H  . Influenza vac split quadrivalent PF  0.5 mL Intramuscular Tomorrow-1000   Continuous Infusions:  PRN Meds: hydrALAZINE  Allergies:   No Known Allergies  Social History:   Social History   Socioeconomic History  . Marital status: Married    Spouse name: Not on file  . Number of children: Not on file  . Years of education: Not on file  . Highest education level: Not on file  Occupational History  . Not on file  Social Needs  . Financial resource strain: Not on file  . Food insecurity:    Worry: Not on file    Inability: Not on file  . Transportation needs:    Medical: Not on file    Non-medical: Not on file  Tobacco Use  . Smoking status: Not on file  Substance and Sexual Activity  . Alcohol use: Not on file  . Drug use: Not on file  . Sexual activity: Not on file  Lifestyle  . Physical activity:    Days per week: Not on file    Minutes per session: Not on file  . Stress: Not on file  Relationships  . Social connections:    Talks on phone: Not on file    Gets together: Not on file    Attends religious service: Not on file    Active member of club or organization: Not on file    Attends meetings of clubs or  organizations: Not on file    Relationship status: Not on file  . Intimate partner violence:    Fear of current or ex partner: Not on file    Emotionally abused: Not on file    Physically abused: Not on file    Forced sexual activity: Not on file  Other Topics Concern  . Not on file  Social History Narrative  . Not on file    Family History:   Family History  Problem Relation Age of Onset  . Heart disease Neg Hx      ROS:  Please see the history of present illness.  All other ROS reviewed and negative.     Physical Exam/Data:   Vitals:   04/16/18 0334 04/16/18 0404 04/16/18 0507 04/16/18 0753  BP: (!) 152/53 (!) 167/70 (!) 164/71 (!) 144/72  Pulse: (!) 35 (!) 44  (!) 34  Resp: 15 14 19 12   Temp:  97.7  F (36.5 C)  97.8 F (36.6 C)  TempSrc:  Oral  Oral  SpO2: 95% 100%  99%  Weight:      Height:        Intake/Output Summary (Last 24 hours) at 04/16/2018 0848 Last data filed at 04/16/2018 0756 Gross per 24 hour  Intake 240 ml  Output 430 ml  Net -190 ml   Filed Weights   04/15/18 1502 04/15/18 1921  Weight: 79.4 kg 101.6 kg   Body mass index is 31.23 kg/m.  General:  Well nourished, well developed, in no acute distress HEENT: normal Lymph: no adenopathy Neck: no JVD Endocrine:  No thryomegaly Vascular: No carotid bruits Cardiac:  RRR; bradycardic, no murmurs, gallops or rubs Lungs:  CTA b/l, no wheezing, rhonchi or rales  Abd: soft, nontender  Ext: no edema Musculoskeletal:  No deformities Skin: warm and dry  Neuro:  no focal abnormalities noted Psych:  Normal affect   EKG:  The EKG was personally reviewed and demonstrates:   SR, Mobitz I with some 2:1 conduction, RBBB, V rate 41 Telemetry:  Telemetry was personally reviewed and demonstrates:   Generally is CHB, V rates 30's  Relevant CV Studies:  Out patient cards not makes referance to a recent echo, report has been requested to be sent stat  Laboratory Data:  Chemistry Recent Labs  Lab 04/15/18 1535 04/15/18 2338  NA 137 136  K 3.8 3.9  CL 104 105  CO2 25 26  GLUCOSE 109* 104*  BUN 21 17  CREATININE 1.62* 1.57*  CALCIUM 9.3 9.1  GFRNONAA 38* 40*  GFRAA 44* 46*  ANIONGAP 8 5    Recent Labs  Lab 04/15/18 1535  PROT 7.8  ALBUMIN 4.0  AST 29  ALT 21  ALKPHOS 56  BILITOT 0.7   Hematology Recent Labs  Lab 04/15/18 1535 04/15/18 2338  WBC 5.4 5.4  RBC 4.26 4.07*  HGB 12.6* 12.1*  HCT 37.3* 36.9*  MCV 87.6 90.7  MCH 29.6 29.7  MCHC 33.8 32.8  RDW 14.3 13.9  PLT 246 228   Cardiac Enzymes Recent Labs  Lab 04/15/18 1535  TROPONINI <0.03   No results for input(s): TROPIPOC in the last 168 hours.  BNPNo results for input(s): BNP, PROBNP in the last 168 hours.  DDimer No results for  input(s): DDIMER in the last 168 hours.  Radiology/Studies:   Dg Chest 2 View Result Date: 04/15/2018 CLINICAL DATA:  Chest pain EXAM: CHEST - 2 VIEW COMPARISON:  Chest x-ray of 4 levin 2011 FINDINGS: No active  infiltrate or effusion is seen. There is slight elevation of both hemidiaphragms. Mild atelectasis is noted at the lung bases as result. Mediastinal and hilar contours are stable and moderate cardiomegaly is stable. No acute bony abnormality is seen. IMPRESSION: Stable chest x-ray with no active lung disease. Stable moderate cardiomegaly. Electronically Signed   By: Dwyane Dee M.D.   On: 04/15/2018 16:07    Assessment and Plan:   1. CHB this AM     Off his labetalol >48hours, persistent CHB this AM     Recommend PPM  I discussed indication for PPM, the potential benefits and risks of the procedure.  The patient states understanding and would like to proceed. I have spoken with medical records at Dr. Dyke Brackett office, she will send his echo from last month Dr. Johney Frame will see later this AM  2. HTN     Resume BB post implant   For questions or updates, please contact [facility] Please consult [email or web address] for contact info under     Signed, Sheilah Pigeon, PA-C  04/16/2018 8:48 AM   I have seen, examined the patient, and reviewed the above assessment and plan.  Changes to above are made where necessary.  On exam, bradycardic regular rhythm.  Pt with complete heart block and slow escape.  No reversible causes have been found.  I would therefore recommend pacemaker implantation at this time.  Risks, benefits, alternatives to pacemaker implantation were discussed in detail with the patient today. The patient understands that the risks include but are not limited to bleeding, infection, pneumothorax, perforation, tamponade, vascular damage, renal failure, MI, stroke, death,  and lead dislodgement and wishes to proceed. We will therefore schedule the procedure at the next  available time.    Co Sign: Hillis Range, MD 04/16/2018 1:46 PM

## 2018-04-16 NOTE — H&P (View-Only) (Signed)
Cardiology Consultation:   Patient ID: Alexander Oconnor MRN: [id number]; DOB: 02/16/1934  Admit date: 04/15/2018 Date of Consult: 04/16/2018  Primary Care Provider: Patient, No Pcp Per Primary Cardiologist: Alexander Oconnor ([facility]) Primary Electrophysiologist:  None    Patient Profile:   Alexander Oconnor is a 83 y.o. 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  History of Present Illness:   Alexander Oconnor was seen by his cardiologist at [facility], he went for his test results (Echo, carotid US, LEA/ABI study).  Reports of those studies are not included in record/note, though mention echo was "stable", recs follow up studies 1 year for all.  It appears that day 03/15/18, he was found in 2:1 AVBlock, w/RBBB, 68bpm and asymptomatic.  Labetalol was reduced to 100mg BID (from 200mg BID) and planned for monitoring and f/u.  He was referred to the ER yesterday by that office for telemetry reports of CHB.  He was seen at [facility], noted to be in advanced heart block, hypertensive 205/80 and transferred to [facility] last evening for further care and management.  LABS today K+ 3.9 BUN/Creat 17/1.57 Mag (yesterday) 1.7 WBC 5.4 H/H 12/36 Plts 228  Patient confirms he was on labetalol, his dose reduced at his visit with his doctor last month, and this past weekend was called by his office Saturday 04/13/18 to stop it because his heart rate was still too slow.  His last dose of labetalol was 04/13/18 morning.  He feels well, states he was working in his yard all weekend, no CP, palpitations, no exertional intolerances.  No dizziness, near syncope or syncope are reported.   Past Medical History:  Diagnosis Date  . Hypertension     History reviewed. No pertinent surgical history.    Inpatient Medications: Scheduled Meds: . aspirin  81 mg Oral Daily  .  heparin  5,000 Units Subcutaneous Q8H  . Influenza vac split quadrivalent PF  0.5 mL Intramuscular Tomorrow-1000   Continuous Infusions:  PRN Meds: hydrALAZINE  Allergies:   No Known Allergies  Social History:   Social History   Socioeconomic History  . Marital status: Married    Spouse name: Not on file  . Number of children: Not on file  . Years of education: Not on file  . Highest education level: Not on file  Occupational History  . Not on file  Social Needs  . Financial resource strain: Not on file  . Food insecurity:    Worry: Not on file    Inability: Not on file  . Transportation needs:    Medical: Not on file    Non-medical: Not on file  Tobacco Use  . Smoking status: Not on file  Substance and Sexual Activity  . Alcohol use: Not on file  . Drug use: Not on file  . Sexual activity: Not on file  Lifestyle  . Physical activity:    Days per week: Not on file    Minutes per session: Not on file  . Stress: Not on file  Relationships  . Social connections:    Talks on phone: Not on file    Gets together: Not on file    Attends religious service: Not on file    Active member of club or organization: Not on file    Attends meetings of clubs or   organizations: Not on file    Relationship status: Not on file  . Intimate partner violence:    Fear of current or ex partner: Not on file    Emotionally abused: Not on file    Physically abused: Not on file    Forced sexual activity: Not on file  Other Topics Concern  . Not on file  Social History Narrative  . Not on file    Family History:   Family History  Problem Relation Age of Onset  . Heart disease Neg Hx      ROS:  Please see the history of present illness.  All other ROS reviewed and negative.     Physical Exam/Data:   Vitals:   04/16/18 0334 04/16/18 0404 04/16/18 0507 04/16/18 0753  BP: (!) 152/53 (!) 167/70 (!) 164/71 (!) 144/72  Pulse: (!) 35 (!) 44  (!) 34  Resp: 15 14 19 12  Temp:  97.7  F (36.5 C)  97.8 F (36.6 C)  TempSrc:  Oral  Oral  SpO2: 95% 100%  99%  Weight:      Height:        Intake/Output Summary (Last 24 hours) at 04/16/2018 0848 Last data filed at 04/16/2018 0756 Gross per 24 hour  Intake 240 ml  Output 430 ml  Net -190 ml   Filed Weights   04/15/18 1502 04/15/18 1921  Weight: 79.4 kg 101.6 kg   Body mass index is 31.23 kg/m.  General:  Well nourished, well developed, in no acute distress HEENT: normal Lymph: no adenopathy Neck: no JVD Endocrine:  No thryomegaly Vascular: No carotid bruits Cardiac:  RRR; bradycardic, no murmurs, gallops or rubs Lungs:  CTA b/l, no wheezing, rhonchi or rales  Abd: soft, nontender  Ext: no edema Musculoskeletal:  No deformities Skin: warm and dry  Neuro:  no focal abnormalities noted Psych:  Normal affect   EKG:  The EKG was personally reviewed and demonstrates:   SR, Mobitz I with some 2:1 conduction, RBBB, V rate 41 Telemetry:  Telemetry was personally reviewed and demonstrates:   Generally is CHB, V rates 30's  Relevant CV Studies:  Out patient cards not makes referance to a recent echo, report has been requested to be sent stat  Laboratory Data:  Chemistry Recent Labs  Lab 04/15/18 1535 04/15/18 2338  NA 137 136  K 3.8 3.9  CL 104 105  CO2 25 26  GLUCOSE 109* 104*  BUN 21 17  CREATININE 1.62* 1.57*  CALCIUM 9.3 9.1  GFRNONAA 38* 40*  GFRAA 44* 46*  ANIONGAP 8 5    Recent Labs  Lab 04/15/18 1535  PROT 7.8  ALBUMIN 4.0  AST 29  ALT 21  ALKPHOS 56  BILITOT 0.7   Hematology Recent Labs  Lab 04/15/18 1535 04/15/18 2338  WBC 5.4 5.4  RBC 4.26 4.07*  HGB 12.6* 12.1*  HCT 37.3* 36.9*  MCV 87.6 90.7  MCH 29.6 29.7  MCHC 33.8 32.8  RDW 14.3 13.9  PLT 246 228   Cardiac Enzymes Recent Labs  Lab 04/15/18 1535  TROPONINI <0.03   No results for input(s): TROPIPOC in the last 168 hours.  BNPNo results for input(s): BNP, PROBNP in the last 168 hours.  DDimer No results for  input(s): DDIMER in the last 168 hours.  Radiology/Studies:   Dg Chest 2 View Result Date: 04/15/2018 CLINICAL DATA:  Chest pain EXAM: CHEST - 2 VIEW COMPARISON:  Chest x-ray of 4 levin 2011 FINDINGS: No active   infiltrate or effusion is seen. There is slight elevation of both hemidiaphragms. Mild atelectasis is noted at the lung bases as result. Mediastinal and hilar contours are stable and moderate cardiomegaly is stable. No acute bony abnormality is seen. IMPRESSION: Stable chest x-ray with no active lung disease. Stable moderate cardiomegaly. Electronically Signed   By: Paul  Barry M.D.   On: 04/15/2018 16:07    Assessment and Plan:   1. CHB this AM     Off his labetalol >48hours, persistent CHB this AM     Recommend PPM  I discussed indication for PPM, the potential benefits and risks of the procedure.  The patient states understanding and would like to proceed. I have spoken with medical records at Alexander Oconnor's office, she will send his echo from last month Dr. Jeryl Wilbourn will see later this AM  2. HTN     Resume BB post implant   For questions or updates, please contact [facility] Please consult [email or web address] for contact info under     Signed, Renee Lynn Ursuy, PA-C  04/16/2018 8:48 AM   I have seen, examined the patient, and reviewed the above assessment and plan.  Changes to above are made where necessary.  On exam, bradycardic regular rhythm.  Pt with complete heart block and slow escape.  No reversible causes have been found.  I would therefore recommend pacemaker implantation at this time.  Risks, benefits, alternatives to pacemaker implantation were discussed in detail with the patient today. The patient understands that the risks include but are not limited to bleeding, infection, pneumothorax, perforation, tamponade, vascular damage, renal failure, MI, stroke, death,  and lead dislodgement and wishes to proceed. We will therefore schedule the procedure at the next  available time.    Co Sign: Bennette Hasty, MD 04/16/2018 1:46 PM   

## 2018-04-16 NOTE — Plan of Care (Signed)
  Problem: Education: Goal: Knowledge of General Education information will improve Description Including pain rating scale, medication(s)/side effects and non-pharmacologic comfort measures Outcome: Completed/Met   Problem: Clinical Measurements: Goal: Respiratory complications will improve Outcome: Completed/Met   Problem: Activity: Goal: Risk for activity intolerance will decrease Outcome: Completed/Met   Problem: Nutrition: Goal: Adequate nutrition will be maintained Outcome: Completed/Met

## 2018-04-17 ENCOUNTER — Inpatient Hospital Stay (HOSPITAL_COMMUNITY): Payer: Medicare Other

## 2018-04-17 ENCOUNTER — Encounter (HOSPITAL_COMMUNITY): Payer: Self-pay | Admitting: Internal Medicine

## 2018-04-17 LAB — BASIC METABOLIC PANEL
ANION GAP: 7 (ref 5–15)
BUN: 17 mg/dL (ref 8–23)
CO2: 23 mmol/L (ref 22–32)
Calcium: 9 mg/dL (ref 8.9–10.3)
Chloride: 104 mmol/L (ref 98–111)
Creatinine, Ser: 1.49 mg/dL — ABNORMAL HIGH (ref 0.61–1.24)
GFR calc Af Amer: 49 mL/min — ABNORMAL LOW (ref >60)
GFR calc non Af Amer: 42 mL/min — ABNORMAL LOW (ref >60)
GLUCOSE: 131 mg/dL — AB (ref 70–99)
Potassium: 4.4 mmol/L (ref 3.5–5.1)
Sodium: 134 mmol/L — ABNORMAL LOW (ref 135–145)

## 2018-04-17 MED FILL — Lidocaine HCl Local Inj 1%: INTRAMUSCULAR | Qty: 60 | Status: AC

## 2018-04-17 NOTE — Discharge Instructions (Signed)
Biventricular Pacemaker Implantation A biventricular pacemaker implantation is a procedure to place (implant) a pacemaker into both of the lower chambers (ventricles) of the heart. A pacemaker is a small, battery-powered device that helps control the heartbeat. If the heart beats irregularly or too slowly (bradycardia), the pacemaker will pace the heart so that it beats at a normal rate or a programmed rate. The parts of a biventricular pacemaker include:  The pulse generator. The pulse generator contains a small computer and a memory system that is programmed to keep the heart beating at a certain rate. The pulse generator also produces the electrical signal that triggers the heart to beat. This is implanted under the skin of the upper chest, near the collarbone.  Wires (leads). The leads are placed in the left and right ventricles of the heart. The leads are connected to the pulse generator. They transmit electrical pulses from the pulse generator to the heart.  This procedure may be done to treat:  Bradycardia.  Symptoms of severe heart failure, such as shortness of breath (dyspnea).  Loss of consciousness that happens repeatedly (syncope) because of an irregular heart rate.  Tell a health care provider about:  Any allergies you have.  All medicines you are taking, including vitamins, herbs, eye drops, creams, and over-the-counter medicines.  Any problems you or family members have had with anesthetic medicines.  Any blood disorders you have.  Any surgeries you have had.  Any medical conditions you have.  Whether you are pregnant or may be pregnant. What are the risks? Generally, this is a safe procedure. However, problems may occur, including:  Infection.  Bleeding.  Allergic reactions to medicines or dyes.  Damage to other structures or organs, such as your blood vessels, lungs, or heart.  Failure of the pacemaker to improve your condition.  What happens before the  procedure?  Ask your health care provider about: ? Changing or stopping your regular medicines. This is especially important if you are taking diabetes medicines or blood thinners. ? Taking medicines such as aspirin and ibuprofen. These medicines can thin your blood. Do not take these medicines before your procedure if your health care provider instructs you not to.  Follow instructions from your health care provider about eating or drinking restrictions.  Do not use any tobacco products for at least 24 hours before your procedure. This includes cigarettes, chewing tobacco, or e-cigarettes.  Ask your health care provider how your surgical site will be marked or identified.  You may be given antibiotic medicine to help prevent infection.  You may have tests, including: ? Blood tests. ? Chest X-rays.  Plan to have someone take you home after the procedure.  If you go home right after the procedure, plan to have someone with you for 24 hours. What happens during the procedure?  To reduce your risk of infection: ? Your health care team will wash or sanitize their hands. ? Your skin will be washed with soap. ? Hair may be removed from your surgical area.  An IV tube will be inserted into one of your veins.  You will be given one or more of the following: ? A medicine to help you relax (sedative). ? A medicine to make you fall asleep (general anesthetic). ? A medicine that is injected into your spine to numb the area below and slightly above the injection site (spinal anesthetic). ? A medicine that is injected into an area of your body to numb everything below the  injection site (regional anesthetic).  An incision will be made in your upper chest, near your heart.  The leads will be guided into your incision, through your blood vessels, and into your ventricles. Your surgeon will use an X-ray machine (fluoroscope) to guide the leads into your heart.  The leads will be attached to  your heart muscles and to the pulse generator.  The leads will be tested to make sure that they work correctly.  The pulse generator will be implanted under your skin, near your incision.  Your incision will be closed with stitches (sutures), skin glue, or adhesive tape.  A bandage (dressing) will be placed over your incision. The procedure may vary among health care providers and hospitals. What happens after the procedure?  Your blood pressure, heart rate, breathing rate, and blood oxygen level will be monitored often until the medicines you were given have worn off.  You may continue to receive fluids and medicines through an IV tube.  You will have some pain. Pain medicines will be available to help you.  You will have a chest X-ray done. This is to make sure that your pacemaker is in the right place.  You may have to wear compression stockings. These stockings help to prevent blood clots and reduce swelling in your legs.  You will be given a pacemaker identification card. This card lists the implant date, device model, and manufacturer of your pacemaker.  Do not drive for 24 hours if you received a sedative. This information is not intended to replace advice given to you by your health care provider. Make sure you discuss any questions you have with your health care provider. Document Released: 03/20/2012 Document Revised: 12/02/2015 Document Reviewed: 03/21/2015 Elsevier Interactive Patient Education  2018 [employer].    Supplemental Discharge Instructions for  Pacemaker/Defibrillator Patients  Activity No heavy lifting or vigorous activity with your left/right arm for 6 to 8 weeks.  Do not raise your left/right arm above your head for one week.  Gradually raise your affected arm as drawn below.             04/20/18                   04/21/18                  04/22/18                04/23/18 __  NO DRIVING for  1 week   ; you may begin driving on  08/22/06    .  WOUND CARE - Keep the wound area clean and dry.  Do not get this area wet, no showers until cleared to at your wound check visit . - The tape/steri-strips on your wound will fall off; do not pull them off.  No bandage is needed on the site.  DO  NOT apply any creams, oils, or ointments to the wound area. - If you notice any drainage or discharge from the wound, any swelling or bruising at the site, or you develop a fever > 101? F after you are discharged home, call the office at once.  Special Instructions - You are still able to use cellular telephones; use the ear opposite the side where you have your pacemaker/defibrillator.  Avoid carrying your cellular phone near your device. - When traveling through airports, show security personnel your identification card to avoid being screened in the metal detectors.  Ask the security personnel to use  the hand wand. - Avoid arc welding equipment, MRI testing (magnetic resonance imaging), TENS units (transcutaneous nerve stimulators).  Call the office for questions about other devices. - Avoid electrical appliances that are in poor condition or are not properly grounded. - Microwave ovens are safe to be near or to operate.  Additional information for defibrillator patients should your device go off: - If your device goes off ONCE and you feel fine afterward, notify the device clinic nurses. - If your device goes off ONCE and you do not feel well afterward, call 911. - If your device goes off TWICE, call 911. - If your device goes off THREE times in one day, call 911.  DO NOT DRIVE YOURSELF OR A FAMILY MEMBER WITH A DEFIBRILLATOR TO THE HOSPITAL--CALL 911.

## 2018-04-17 NOTE — Progress Notes (Signed)
Doing well s/p PPM Device interrogation and CXR are personally reviewed and normal.  BP is nicely improving.  Creatinine is better with increased HR.  DC to home with routine wound care and follow-up Resume home medicines at discharge  Hillis Range MD, [facility] 04/17/2018 8:11 AM

## 2018-04-17 NOTE — Discharge Summary (Addendum)
ELECTROPHYSIOLOGY PROCEDURE DISCHARGE SUMMARY    Patient ID: Alexander Oconnor,  MRN: [id number], DOB/AGE: Jan 24, 1934 84 y.o.  Admit date: 04/15/2018 Discharge date: 04/17/2018  Primary Care Physician: Patient, No Pcp Per  Primary Cardiologist: Dr. Hanley Hays [facility]) Electrophysiologist: new, Dr. Johney Frame  Primary Discharge Diagnosis:  1. CHB  Secondary Discharge Diagnosis:  1. HTN 2. HLD 3. obesity  No Known Allergies   Procedures This Admission:  1.  Implantation of a SJM dual chamber PPM on 04/16/18 by Dr Johney Frame.  The patient received a Public house manager Assurity MRI model [id number] (serial number  [id number]) pacemaker, [employer] Tendril MRI model [id number]- 52 (serial number  [id number]) right atrial lead and a [employer] Tendril MRI model [id number]  (serial number  [id number]) right ventricular lead  There were no immediate post procedure complications. 2.  CXR on 04/17/18 demonstrated no pneumothorax status post device implantation.   Brief HPI: Alexander Oconnor is a 84 y.o. male  with a hx of HTN, HLD, OA, DM, gout, CKD, RBBB, some degree of PVD(LE) and carotid disease, VHD w/MR referred to the ER by his cardiologits's office for [facility] on his event monitor.  Hospital Course:  The patient was seen by his cardiologist at [facility], he went for his test results (Echo, carotid US, LEA/ABI study).  Reports of those studies are not included in record/note, though mention echo was "stable", recs follow up studies 1 year for all.  It appears that day 03/15/18, he was found in 2:1 AVBlock, w/RBBB, 68bpm and asymptomatic.  Labetalol was reduced to 100mg  BID (from 200mg  BID) and planned for monitoring and f/u. He confirmed he was on labetalol, his dose reduced at his visit with his doctor last month, and this past weekend was called by his office Saturday 04/13/18 to stop it because his heart rate was still too slow.  His last dose of labetalol was 04/13/18 morning  He was  referred to the ER Monday by that office for telemetry reports of CHB.  He was seen at [facility], noted to be in advanced heart block, hypertensive 205/80 and transferred to [facility].  He denied anu kind of symptomatology, out of hospital or while here.  He had persistent CHB and recommended PPM implant.  Echo noted LVEF 55-60%, no significant VHD.  He underwent implantation of a PPM with details as outlined above. He was monitored on telemetry overnight which demonstrated AV paced rhythm.  Left chest was without hematoma or ecchymosis.  The device was interrogated and found to be functioning normally.  CXR was obtained and demonstrated no pneumothorax status post device implantation.  Wound care, arm mobility, and restrictions were reviewed with the patient.  His home labetalol resumed post PPM, with improvemnt in his BP.  The patient feels well, no CP or SOB, he was examined by Dr. Johney Frame and considered stable for discharge to home. The patient has been instructed to continue follow up with his primary cardiologist/physicians, routine EP follow up is in place.   Physical Exam: Vitals:   04/16/18 2041 04/16/18 2330 04/17/18 0450 04/17/18 0757  BP: 134/69 (!) 145/81 134/71 (!) 145/75  Pulse: 71 67 63 66  Resp: (!) 23 16 17 12   Temp: 98.7 F (37.1 C) 98 F (36.7 C) 98.1 F (36.7 C) 97.7 F (36.5 C)  TempSrc: Oral Oral Oral Oral  SpO2: 98% 100% 98% 97%  Weight:      Height:  GEN- The patient is well appearing, alert and oriented x 3 today.   HEENT: normocephalic, atraumatic; sclera clear, conjunctiva pink; hearing intact; oropharynx clear; neck supple, no JVP Lungs- CTA b/l, normal work of breathing.  No wheezes, rales, rhonchi Heart- RRR, no murmurs, rubs or gallops, PMI not laterally displaced GI- soft, non-tender, non-distended Extremities- no clubbing, cyanosis, or edema MS- no significant deformity or atrophy Skin- warm and dry, no rash or lesion, left chest without  hematoma/ecchymosis Psych- euthymic mood, full affect Neuro- no gross deficits   Labs:   Lab Results  Component Value Date   WBC 5.4 04/15/2018   HGB 12.1 (L) 04/15/2018   HCT 36.9 (L) 04/15/2018   MCV 90.7 04/15/2018   PLT 228 04/15/2018    Recent Labs  Lab 04/15/18 1535  04/17/18 0426  NA 137   < > 134*  K 3.8   < > 4.4  CL 104   < > 104  CO2 25   < > 23  BUN 21   < > 17  CREATININE 1.62*   < > 1.49*  CALCIUM 9.3   < > 9.0  PROT 7.8  --   --   BILITOT 0.7  --   --   ALKPHOS 56  --   --   ALT 21  --   --   AST 29  --   --   GLUCOSE 109*   < > 131*   < > = values in this interval not displayed.    Discharge Medications:  Allergies as of 04/17/2018   No Known Allergies     Medication List    TAKE these medications   allopurinol 100 MG tablet Commonly known as:  ZYLOPRIM Take 200 mg by mouth daily.   aspirin EC 325 MG tablet Take 325 mg by mouth as needed (for muscle soreness or pain).   cetirizine 10 MG tablet Commonly known as:  ZYRTEC Take 10 mg by mouth daily as needed for allergies or rhinitis.   finasteride 5 MG tablet Commonly known as:  PROSCAR Take 5 mg by mouth daily.   labetalol 100 MG tablet Commonly known as:  NORMODYNE Take 100 mg by mouth 2 (two) times daily.   MAGNESIUM PO Take 1 tablet by mouth daily.   naproxen sodium 220 MG tablet Commonly known as:  ALEVE Take 220-440 mg by mouth 2 (two) times daily as needed (for muscle soreness or discomfort).   rosuvastatin 40 MG tablet Commonly known as:  CRESTOR Take 40 mg by mouth at bedtime.   spironolactone 50 MG tablet Commonly known as:  ALDACTONE Take 50 mg by mouth daily.       Disposition: Home Discharge Instructions    Diet - low sodium heart healthy   Complete by:  As directed    Increase activity slowly   Complete by:  As directed      Follow-up Information    Sherrill Raring, MD Follow up.   Specialty:  Family Medicine Why:  Call for a post hospital follow  up visit Contact information: [address] [facility] [place] [address] [phone]        Hillis Range, MD Follow up on 07/17/2018.   Specialty:  Cardiology Why:  10:00AM Contact information: [address] [place] [address] [phone]        [facility] [address] [facility] Follow up on 04/30/2018.   Specialty:  Cardiology Why:  10:00AM, wound check  visit Contact information: [address] [place] [address] [phone]          Duration of Discharge Encounter: Greater than 30 minutes including physician time.  Norma Fredrickson, PA-C 04/17/2018 10:09 AM  Device interrogation is reviewed and normal.  DC to home with routine wound care and follow-up.

## 2018-04-30 ENCOUNTER — Ambulatory Visit (INDEPENDENT_AMBULATORY_CARE_PROVIDER_SITE_OTHER): Payer: Medicare Other | Admitting: *Deleted

## 2018-04-30 DIAGNOSIS — I442 Atrioventricular block, complete: Secondary | ICD-10-CM | POA: Diagnosis not present

## 2018-04-30 LAB — CUP PACEART INCLINIC DEVICE CHECK
Battery Remaining Longevity: 126 mo
Battery Voltage: 3.08 V
Date Time Interrogation Session: 20191022104712
Implantable Lead Implant Date: 20191008
Implantable Lead Implant Date: 20191008
Implantable Lead Location: 753859
Implantable Pulse Generator Implant Date: 20191008
Lead Channel Impedance Value: 550 Ohm
Lead Channel Pacing Threshold Amplitude: 0.75 V
Lead Channel Pacing Threshold Pulse Width: 0.5 ms
Lead Channel Pacing Threshold Pulse Width: 0.5 ms
Lead Channel Sensing Intrinsic Amplitude: 3.9 mV
Lead Channel Setting Pacing Amplitude: 0.75 V
Lead Channel Setting Pacing Amplitude: 1.5 V
Lead Channel Setting Sensing Sensitivity: 2 mV
MDC IDC LEAD LOCATION: 753860
MDC IDC MSMT LEADCHNL RA PACING THRESHOLD AMPLITUDE: 0.5 V
MDC IDC MSMT LEADCHNL RV IMPEDANCE VALUE: 662.5 Ohm
MDC IDC MSMT LEADCHNL RV SENSING INTR AMPL: 12 mV
MDC IDC SET LEADCHNL RV PACING PULSEWIDTH: 0.5 ms
MDC IDC STAT BRADY RA PERCENT PACED: 82 %
MDC IDC STAT BRADY RV PERCENT PACED: 99.98 %
Pulse Gen Model: 2272
Pulse Gen Serial Number: 9068789

## 2018-04-30 NOTE — Progress Notes (Signed)
Wound check appointment. Steri-strips removed. Wound without redness or edema. Incision edges approximated, wound well healed. Normal device function. Thresholds, sensing, and impedances consistent with implant measurements. Device programmed at auto capture programmed on for extra safety margin until 3 month visit. Histogram distribution appropriate for patient and level of activity. 1 mode switches-- EGM appears AT, duration 10 seconds, Peak A rate 169 bpm, Peak V rate 62 bpm. No high ventricular rates noted. Patient educated about wound care, arm mobility, lifting restrictions. ROV with JA 07/17/2018

## 2018-07-17 ENCOUNTER — Encounter: Payer: Self-pay | Admitting: Internal Medicine

## 2018-07-17 ENCOUNTER — Ambulatory Visit (INDEPENDENT_AMBULATORY_CARE_PROVIDER_SITE_OTHER): Payer: Medicare Other | Admitting: Internal Medicine

## 2018-07-17 VITALS — BP 164/88 | HR 67 | Ht 71.0 in | Wt 230.4 lb

## 2018-07-17 DIAGNOSIS — I10 Essential (primary) hypertension: Secondary | ICD-10-CM | POA: Diagnosis not present

## 2018-07-17 DIAGNOSIS — Z950 Presence of cardiac pacemaker: Secondary | ICD-10-CM | POA: Diagnosis not present

## 2018-07-17 DIAGNOSIS — I442 Atrioventricular block, complete: Secondary | ICD-10-CM | POA: Diagnosis not present

## 2018-07-17 LAB — CUP PACEART INCLINIC DEVICE CHECK
Brady Statistic RV Percent Paced: 99.9 %
Date Time Interrogation Session: 20200108142212
Implantable Lead Implant Date: 20191008
Implantable Lead Implant Date: 20191008
Implantable Lead Location: 753860
Implantable Pulse Generator Implant Date: 20191008
Lead Channel Impedance Value: 587.5 Ohm
Lead Channel Pacing Threshold Amplitude: 0.625 V
Lead Channel Pacing Threshold Pulse Width: 0.5 ms
Lead Channel Sensing Intrinsic Amplitude: 11.3 mV
Lead Channel Setting Pacing Amplitude: 1.625
Lead Channel Setting Pacing Pulse Width: 0.5 ms
Lead Channel Setting Sensing Sensitivity: 2 mV
MDC IDC LEAD LOCATION: 753859
MDC IDC MSMT BATTERY REMAINING LONGEVITY: 126 mo
MDC IDC MSMT BATTERY VOLTAGE: 3.02 V
MDC IDC MSMT LEADCHNL RA IMPEDANCE VALUE: 487.5 Ohm
MDC IDC MSMT LEADCHNL RA PACING THRESHOLD PULSEWIDTH: 0.5 ms
MDC IDC MSMT LEADCHNL RA SENSING INTR AMPL: 3.7 mV
MDC IDC MSMT LEADCHNL RV PACING THRESHOLD AMPLITUDE: 0.5 V
MDC IDC PG SERIAL: 9068789
MDC IDC SET LEADCHNL RV PACING AMPLITUDE: 0.75 V
MDC IDC STAT BRADY RA PERCENT PACED: 47 %
Pulse Gen Model: 2272

## 2018-07-17 NOTE — Progress Notes (Signed)
    PCP: Charlette Caffey, MD Primary Cardiologist: Dr. Hanley Hays [facility] cardiology) Primary EP:  Dr Johney Frame  Alexander Oconnor is a 85 y.o. male who presents today for routine electrophysiology followup.  Since his PPM implant, the patient reports doing very well.  Today, he denies symptoms of palpitations, chest pain, shortness of breath,  lower extremity edema, dizziness, presyncope, or syncope.  The patient is otherwise without complaint today.   Past Medical History:  Diagnosis Date  . Complete heart block (HCC)   . Hypertension    Past Surgical History:  Procedure Laterality Date  . PACEMAKER IMPLANT N/A 04/16/2018   SJM Assurity MRI dual chamber PPM implanted by Dr Johney Frame for CHB    ROS- all systems are reviewed and negative except as per HPI above  Current Outpatient Medications  Medication Sig Dispense Refill  . allopurinol (ZYLOPRIM) 100 MG tablet Take 200 mg by mouth daily.  1  . aspirin EC 325 MG tablet Take 325 mg by mouth as needed (for muscle soreness or pain).    . cetirizine (ZYRTEC) 10 MG tablet Take 10 mg by mouth daily as needed for allergies or rhinitis.    . finasteride (PROSCAR) 5 MG tablet Take 5 mg by mouth daily.  5  . labetalol (NORMODYNE) 100 MG tablet Take 100 mg by mouth 2 (two) times daily.  3  . MAGNESIUM PO Take 1 tablet by mouth daily.    . naproxen sodium (ALEVE) 220 MG tablet Take 220-440 mg by mouth 2 (two) times daily as needed (for muscle soreness or discomfort).     . rosuvastatin (CRESTOR) 40 MG tablet Take 40 mg by mouth at bedtime.  2  . spironolactone (ALDACTONE) 50 MG tablet Take 50 mg by mouth daily.  5   No current facility-administered medications for this visit.     Physical Exam: Vitals:   07/17/18 1335  BP: (!) 164/88  Pulse: 67  SpO2: 98%  Weight: 230 lb 6.4 oz (104.5 kg)  Height: 5\' 11"  (1.803 m)    GEN- The patient is well appearing, alert and oriented x 3 today.   Head- normocephalic, atraumatic Eyes-  Sclera clear,  conjunctiva pink Ears- hearing intact Oropharynx- clear Lungs- Clear to ausculation bilaterally, normal work of breathing Chest- pacemaker pocket is well healed Heart- Regular rate and rhythm, no murmurs, rubs or gallops, PMI not laterally displaced GI- soft, NT, ND, + BS Extremities- no clubbing, cyanosis, or edema  Pacemaker interrogation- reviewed in detail today,  See PACEART report  ekg tracing ordered today is personally reviewed and shows sinus with V pacing  Assessment and Plan:  1. complete heart block Normal pacemaker function See Pace Art report No changes today  2. HTN Elevated.  He reports good control at home and does not wish to make changes today  Follow-up with Dr Hanley Hays as scheduled Merlin Return to see EP NP in a year  Hillis Range MD, [facility] 07/17/2018 2:00 PM

## 2018-07-17 NOTE — Patient Instructions (Addendum)
Medication Instructions:  Your physician recommends that you continue on your current medications as directed. Please refer to the Current Medication list given to you today.  *If you need a refill on your cardiac medications before your next appointment, please call your pharmacy*  Labwork: None ordered  Testing/Procedures: None ordered  Follow-Up: Remote monitoring is used to monitor your Pacemaker or ICD from home. This monitoring reduces the number of office visits required to check your device to one time per year. It allows [place] to keep an eye on the functioning of your device to ensure it is working properly. You are scheduled for a device check from home on 10/16/18. You may send your transmission at any time that day. If you have a wireless device, the transmission will be sent automatically. After your physician reviews your transmission, you will receive a postcard with your next transmission date.  Your physician wants you to follow-up in: 1 year with Alexander Balsam, NP.  You will receive a reminder letter in the mail two months in advance. If you don't receive a letter, please call our office to schedule the follow-up appointment.  Thank you for choosing [facility]!!

## 2018-10-16 ENCOUNTER — Telehealth: Payer: Self-pay

## 2018-10-16 ENCOUNTER — Other Ambulatory Visit: Payer: Self-pay

## 2018-10-16 ENCOUNTER — Ambulatory Visit (INDEPENDENT_AMBULATORY_CARE_PROVIDER_SITE_OTHER): Payer: Medicare Other | Admitting: *Deleted

## 2018-10-16 DIAGNOSIS — I442 Atrioventricular block, complete: Secondary | ICD-10-CM | POA: Diagnosis not present

## 2018-10-16 LAB — CUP PACEART REMOTE DEVICE CHECK
Date Time Interrogation Session: 20200408121014
Implantable Lead Implant Date: 20191008
Implantable Lead Implant Date: 20191008
Implantable Lead Location: 753859
Implantable Lead Location: 753860
Implantable Pulse Generator Implant Date: 20191008
Pulse Gen Model: 2272
Pulse Gen Serial Number: 9068789

## 2018-10-16 NOTE — Telephone Encounter (Signed)
Spoke with patient to remind of missed remote transmission 

## 2018-10-25 NOTE — Progress Notes (Signed)
Remote pacemaker transmission.   

## 2019-01-15 ENCOUNTER — Ambulatory Visit (INDEPENDENT_AMBULATORY_CARE_PROVIDER_SITE_OTHER): Payer: Medicare Other | Admitting: *Deleted

## 2019-01-15 DIAGNOSIS — I442 Atrioventricular block, complete: Secondary | ICD-10-CM

## 2019-01-15 LAB — CUP PACEART REMOTE DEVICE CHECK
Battery Remaining Longevity: 122 mo
Battery Remaining Percentage: 95.5 %
Battery Voltage: 3.01 V
Brady Statistic AP VP Percent: 57 %
Brady Statistic AP VS Percent: 1 %
Brady Statistic AS VP Percent: 43 %
Brady Statistic AS VS Percent: 1 %
Brady Statistic RA Percent Paced: 56 %
Brady Statistic RV Percent Paced: 99 %
Date Time Interrogation Session: 20200708060013
Implantable Lead Implant Date: 20191008
Implantable Lead Implant Date: 20191008
Implantable Lead Location: 753859
Implantable Lead Location: 753860
Implantable Pulse Generator Implant Date: 20191008
Lead Channel Impedance Value: 480 Ohm
Lead Channel Impedance Value: 560 Ohm
Lead Channel Pacing Threshold Amplitude: 0.5 V
Lead Channel Pacing Threshold Amplitude: 0.5 V
Lead Channel Pacing Threshold Pulse Width: 0.5 ms
Lead Channel Pacing Threshold Pulse Width: 0.5 ms
Lead Channel Sensing Intrinsic Amplitude: 1.8 mV
Lead Channel Sensing Intrinsic Amplitude: 8.4 mV
Lead Channel Setting Pacing Amplitude: 0.75 V
Lead Channel Setting Pacing Amplitude: 1.5 V
Lead Channel Setting Pacing Pulse Width: 0.5 ms
Lead Channel Setting Sensing Sensitivity: 2 mV
Pulse Gen Model: 2272
Pulse Gen Serial Number: 9068789

## 2019-01-27 ENCOUNTER — Encounter: Payer: Self-pay | Admitting: Cardiology

## 2019-01-27 NOTE — Progress Notes (Signed)
Remote pacemaker transmission.   

## 2019-04-16 ENCOUNTER — Ambulatory Visit (INDEPENDENT_AMBULATORY_CARE_PROVIDER_SITE_OTHER): Payer: Medicare Other | Admitting: *Deleted

## 2019-04-16 DIAGNOSIS — I442 Atrioventricular block, complete: Secondary | ICD-10-CM

## 2019-04-17 LAB — CUP PACEART REMOTE DEVICE CHECK
Battery Remaining Longevity: 119 mo
Battery Remaining Percentage: 95.5 %
Battery Voltage: 3.01 V
Brady Statistic AP VP Percent: 59 %
Brady Statistic AP VS Percent: 1 %
Brady Statistic AS VP Percent: 41 %
Brady Statistic AS VS Percent: 1 %
Brady Statistic RA Percent Paced: 59 %
Brady Statistic RV Percent Paced: 99 %
Date Time Interrogation Session: 20201007060023
Implantable Lead Implant Date: 20191008
Implantable Lead Implant Date: 20191008
Implantable Lead Location: 753859
Implantable Lead Location: 753860
Implantable Pulse Generator Implant Date: 20191008
Lead Channel Impedance Value: 450 Ohm
Lead Channel Impedance Value: 560 Ohm
Lead Channel Pacing Threshold Amplitude: 0.5 V
Lead Channel Pacing Threshold Amplitude: 0.5 V
Lead Channel Pacing Threshold Pulse Width: 0.5 ms
Lead Channel Pacing Threshold Pulse Width: 0.5 ms
Lead Channel Sensing Intrinsic Amplitude: 12 mV
Lead Channel Sensing Intrinsic Amplitude: 2.1 mV
Lead Channel Setting Pacing Amplitude: 0.75 V
Lead Channel Setting Pacing Amplitude: 1.5 V
Lead Channel Setting Pacing Pulse Width: 0.5 ms
Lead Channel Setting Sensing Sensitivity: 2 mV
Pulse Gen Model: 2272
Pulse Gen Serial Number: 9068789

## 2019-04-25 NOTE — Progress Notes (Signed)
Remote pacemaker transmission.   

## 2019-07-16 ENCOUNTER — Ambulatory Visit (INDEPENDENT_AMBULATORY_CARE_PROVIDER_SITE_OTHER): Payer: Medicare Other | Admitting: *Deleted

## 2019-07-16 DIAGNOSIS — I442 Atrioventricular block, complete: Secondary | ICD-10-CM | POA: Diagnosis not present

## 2019-07-16 LAB — CUP PACEART REMOTE DEVICE CHECK
Battery Remaining Longevity: 122 mo
Battery Remaining Percentage: 95.5 %
Battery Voltage: 3.01 V
Brady Statistic AP VP Percent: 60 %
Brady Statistic AP VS Percent: 1 %
Brady Statistic AS VP Percent: 40 %
Brady Statistic AS VS Percent: 1 %
Brady Statistic RA Percent Paced: 60 %
Brady Statistic RV Percent Paced: 99 %
Date Time Interrogation Session: 20210106020011
Implantable Lead Implant Date: 20191008
Implantable Lead Implant Date: 20191008
Implantable Lead Location: 753859
Implantable Lead Location: 753860
Implantable Pulse Generator Implant Date: 20191008
Lead Channel Impedance Value: 450 Ohm
Lead Channel Impedance Value: 560 Ohm
Lead Channel Pacing Threshold Amplitude: 0.5 V
Lead Channel Pacing Threshold Amplitude: 0.5 V
Lead Channel Pacing Threshold Pulse Width: 0.5 ms
Lead Channel Pacing Threshold Pulse Width: 0.5 ms
Lead Channel Sensing Intrinsic Amplitude: 11.2 mV
Lead Channel Sensing Intrinsic Amplitude: 2.7 mV
Lead Channel Setting Pacing Amplitude: 0.75 V
Lead Channel Setting Pacing Amplitude: 1.5 V
Lead Channel Setting Pacing Pulse Width: 0.5 ms
Lead Channel Setting Sensing Sensitivity: 2 mV
Pulse Gen Model: 2272
Pulse Gen Serial Number: 9068789

## 2019-08-05 IMAGING — CR DG CHEST 2V
2 series · 2 of 2 positions shown · non-contrast
Comparison: 04/15/2018

CLINICAL DATA: Pacemaker placement

EXAM:
CHEST - 2 VIEW

[chest lat]
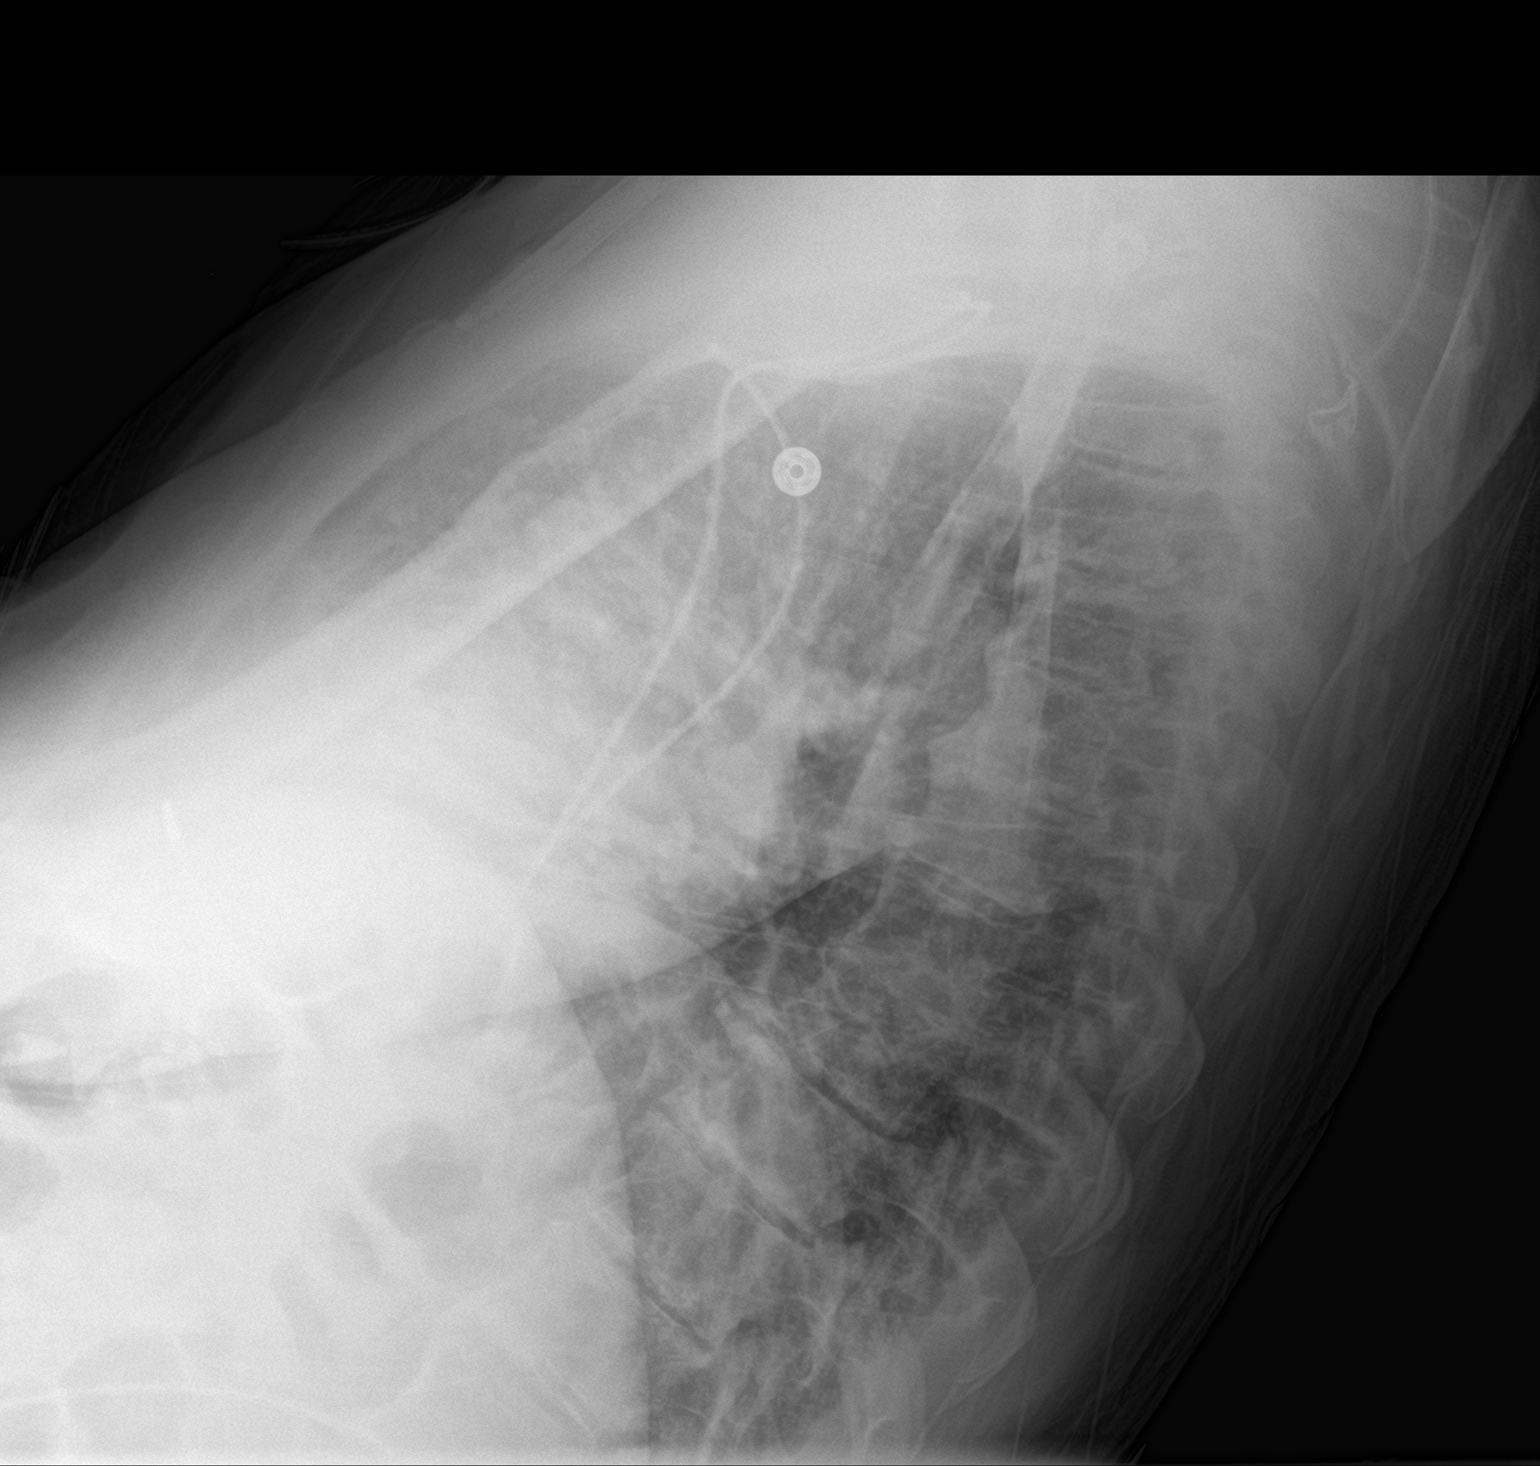

[chest ap]
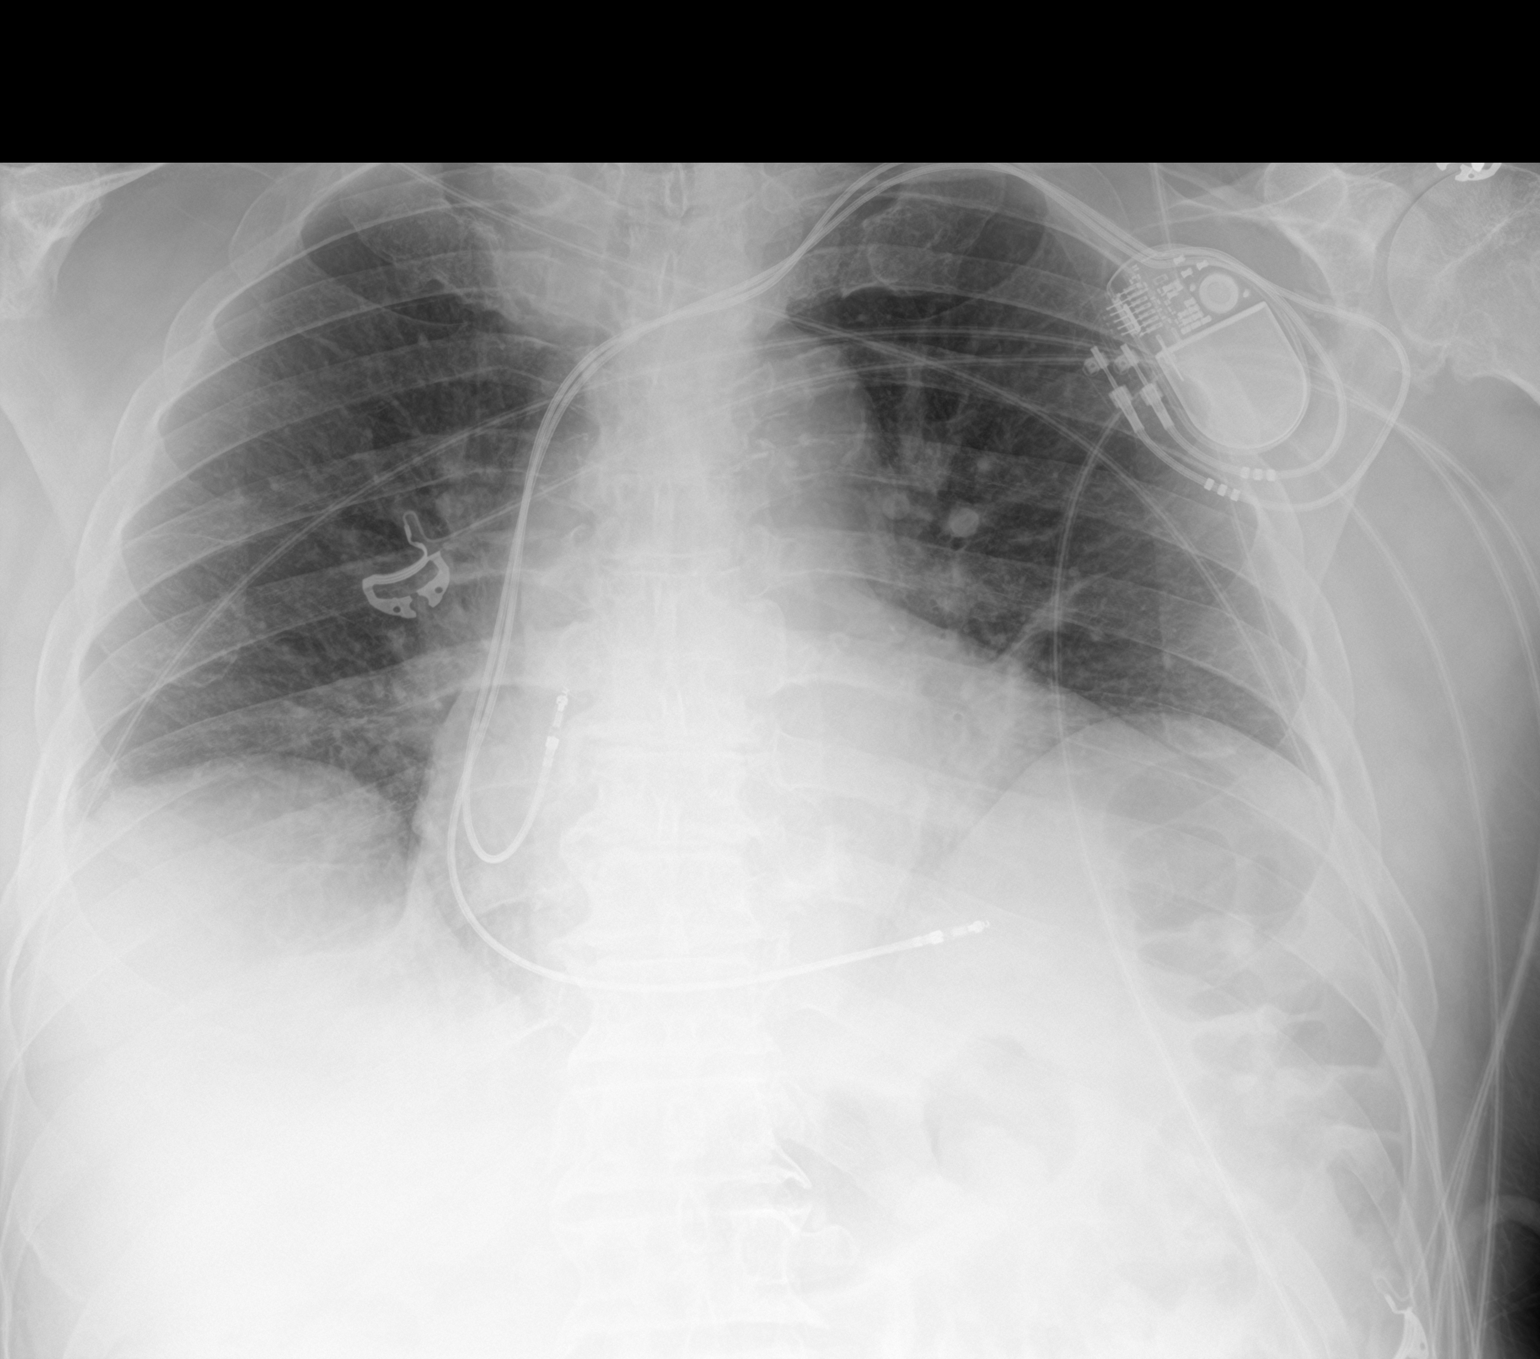

[2 of 2 positions shown; findings below may reference images not displayed]

FINDINGS: Left subclavian transvenous dual lead pacemaker in good position
with leads in the right atrium and right ventricle. No pneumothorax

Hypoventilation with mild atelectasis in the bases. Negative for
heart failure or effusion
IMPRESSION: Satisfactory dual lead pacemaker.

## 2019-09-24 NOTE — Progress Notes (Signed)
Electrophysiology Office Note Date: 09/26/2019  ID:  Alexander Oconnor, DOB 10-25-1933, MRN [id number]  PCP: Alexander Bald, MD Primary Cardiologist: Alexander Oconnor Electrophysiologist: Allred  CC: Pacemaker follow-up  Alexander Oconnor is a 86 y.o. male seen today for Alexander Oconnor.  He presents today for routine electrophysiology followup.  Since last being seen in our clinic, the patient reports doing very well.  He denies chest pain, palpitations, dyspnea, PND, orthopnea, nausea, vomiting, dizziness, syncope, edema, weight gain, or early satiety.  Device History: STJ dual chamber PPM implanted 2019 for complete heart block    Past Medical History:  Diagnosis Date  . Complete heart block ([place])   . Hypertension    Past Surgical History:  Procedure Laterality Date  . PACEMAKER IMPLANT N/A 04/16/2018   SJM Assurity MRI dual chamber PPM implanted by Alexander Oconnor for CHB    Current Outpatient Medications  Medication Sig Dispense Refill  . allopurinol (ZYLOPRIM) 100 MG tablet Take 200 mg by mouth daily.  1  . aspirin EC 325 MG tablet Take 325 mg by mouth as needed (for muscle soreness or pain).    . cetirizine (ZYRTEC) 10 MG tablet Take 10 mg by mouth daily as needed for allergies or rhinitis.    . finasteride (PROSCAR) 5 MG tablet Take 5 mg by mouth daily.  5  . labetalol (NORMODYNE) 100 MG tablet Take 100 mg by mouth 2 (two) times daily.  3  . MAGNESIUM PO Take 1 tablet by mouth daily.    . naproxen sodium (ALEVE) 220 MG tablet Take 220-440 mg by mouth 2 (two) times daily as needed (for muscle soreness or discomfort).     . rosuvastatin (CRESTOR) 40 MG tablet Take 40 mg by mouth at bedtime.  2  . spironolactone (ALDACTONE) 50 MG tablet Take 50 mg by mouth daily.  5   No current facility-administered medications for this visit.    Allergies:   Patient has no known allergies.   Social History: Social History   Socioeconomic History  . Marital status: Married    Spouse name: Not on  file  . Number of children: Not on file  . Years of education: Not on file  . Highest education level: Not on file  Occupational History  . Not on file  Tobacco Use  . Smoking status: Former Smoker    Packs/day: 0.25    Years: 20.00    Pack years: 5.00    Types: Cigarettes    Quit date: 1974    Years since quitting: 47.2  . Smokeless tobacco: Never Used  Substance and Sexual Activity  . Alcohol use: Not on file  . Drug use: Never  . Sexual activity: Not on file  Other Topics Concern  . Not on file  Social History Narrative  . Not on file   Social Determinants of Health   Financial Resource Strain:   . Difficulty of Paying Living Expenses:   Food Insecurity:   . Worried About Charity fundraiser in the Last Year:   . Arboriculturist in the Last Year:   Transportation Needs:   . Film/video editor (Medical):   Marland Kitchen Lack of Transportation (Non-Medical):   Physical Activity:   . Days of Exercise per Week:   . Minutes of Exercise per Session:   Stress:   . Feeling of Stress :   Social Connections:   . Frequency of Communication with Friends and Family:   . Frequency  of Social Gatherings with Friends and Family:   . Attends Religious Services:   . Active Member of Clubs or Organizations:   . Attends Banker Meetings:   Marland Kitchen Marital Status:   Intimate Partner Violence:   . Fear of Current or Ex-Partner:   . Emotionally Abused:   Marland Kitchen Physically Abused:   . Sexually Abused:     Family History: Family History  Problem Relation Age of Onset  . Heart disease Neg Hx      Review of Systems: All other systems reviewed and are otherwise negative except as noted above.   Physical Exam: VS:  BP (!) 146/84   Pulse 74   Ht 5\' 11"  (1.803 m)   Wt 236 lb 3.2 oz (107.1 kg)   SpO2 97%   BMI 32.94 kg/m  , BMI Body mass index is 32.94 kg/m.  GEN- The patient is well appearing, alert and oriented x 3 today.   HEENT: normocephalic, atraumatic; sclera clear,  conjunctiva pink; hearing intact; oropharynx clear; neck supple  Lungs- Clear to ausculation bilaterally, normal work of breathing.  No wheezes, rales, rhonchi Heart- Regular rate and rhythm, no murmurs, rubs or gallops  GI- soft, non-tender, non-distended, bowel sounds present  Extremities- no clubbing, cyanosis, or edema  MS- no significant deformity or atrophy Skin- warm and dry, no rash or lesion; PPM pocket well healed Psych- euthymic mood, full affect Neuro- strength and sensation are intact  PPM Interrogation- reviewed in detail today,  See PACEART report  EKG:  EKG is not ordered today.  Recent Labs: No results found for requested labs within last 8760 hours.   Wt Readings from Last 3 Encounters:  09/26/19 236 lb 3.2 oz (107.1 kg)  07/17/18 230 lb 6.4 oz (104.5 kg)  04/15/18 223 lb 14.4 oz (101.6 kg)     Other studies Reviewed: Additional studies/ records that were reviewed today include: Alexander 06/15/18 office notes   Assessment and Plan:  1.  Complete heart block Normal PPM function See Pace Art report No changes today He is not device dependent today   2.  HTN BP elevated today He is followed closely by Alexander Jenel Lucks for BP management   Current medicines are reviewed at length with the patient today.   The patient does not have concerns regarding his medicines.  The following changes were made today:  none  Labs/ tests ordered today include: none No orders of the defined types were placed in this encounter.    Disposition:   Follow up with Merlin, me in 1 year     Signed, Hanley Hays, NP 09/26/2019 8:34 AM  [facility] [address] [place] [phone] (office) [phone] (fax)

## 2019-09-26 ENCOUNTER — Encounter: Payer: Self-pay | Admitting: Nurse Practitioner

## 2019-09-26 ENCOUNTER — Ambulatory Visit (INDEPENDENT_AMBULATORY_CARE_PROVIDER_SITE_OTHER): Payer: Medicare Other | Admitting: Nurse Practitioner

## 2019-09-26 ENCOUNTER — Other Ambulatory Visit: Payer: Self-pay

## 2019-09-26 VITALS — BP 146/84 | HR 74 | Ht 71.0 in | Wt 236.2 lb

## 2019-09-26 DIAGNOSIS — I442 Atrioventricular block, complete: Secondary | ICD-10-CM | POA: Diagnosis not present

## 2019-09-26 DIAGNOSIS — I10 Essential (primary) hypertension: Secondary | ICD-10-CM | POA: Diagnosis not present

## 2019-09-26 LAB — CUP PACEART INCLINIC DEVICE CHECK
Battery Remaining Longevity: 124 mo
Battery Voltage: 2.99 V
Brady Statistic RA Percent Paced: 60 %
Brady Statistic RV Percent Paced: 99.8 %
Date Time Interrogation Session: 20210319083913
Implantable Lead Implant Date: 20191008
Implantable Lead Implant Date: 20191008
Implantable Lead Location: 753859
Implantable Lead Location: 753860
Implantable Pulse Generator Implant Date: 20191008
Lead Channel Impedance Value: 487.5 Ohm
Lead Channel Impedance Value: 575 Ohm
Lead Channel Pacing Threshold Amplitude: 0.5 V
Lead Channel Pacing Threshold Amplitude: 0.5 V
Lead Channel Pacing Threshold Pulse Width: 0.5 ms
Lead Channel Pacing Threshold Pulse Width: 0.5 ms
Lead Channel Sensing Intrinsic Amplitude: 11.2 mV
Lead Channel Sensing Intrinsic Amplitude: 2 mV
Lead Channel Setting Pacing Amplitude: 0.75 V
Lead Channel Setting Pacing Amplitude: 1.5 V
Lead Channel Setting Pacing Pulse Width: 0.5 ms
Lead Channel Setting Sensing Sensitivity: 2 mV
Pulse Gen Model: 2272
Pulse Gen Serial Number: 9068789

## 2019-09-26 NOTE — Patient Instructions (Addendum)
Medication Instructions:  none *If you need a refill on your cardiac medications before your next appointment, please call your pharmacy*   Lab Work: none If you have labs (blood work) drawn today and your tests are completely normal, you will receive your results only by: Marland Kitchen MyChart Message (if you have MyChart) OR . A paper copy in the mail If you have any lab test that is abnormal or we need to change your treatment, we will call you to review the results.   Testing/Procedures: none   Follow-Up: At [facility], you and your health needs are our priority.  As part of our continuing mission to provide you with exceptional heart care, we have created designated Provider Care Teams.  These Care Teams include your primary Cardiologist (physician) and Advanced Practice Providers (APPs -  Physician Assistants and Nurse Practitioners) who all work together to provide you with the care you need, when you need it.   Your next appointment:   1 year(s)  The format for your next appointment:   Either In Person or Virtual  Provider:   Dr Johney Frame   Other Instructions Remote monitoring is used to monitor your Pacemaker  from home. This monitoring reduces the number of office visits required to check your device to one time per year. It allows [place] to keep an eye on the functioning of your device to ensure it is working properly. You are scheduled for a device check from home on 10/15/19. You may send your transmission at any time that day. If you have a wireless device, the transmission will be sent automatically. After your physician reviews your transmission, you will receive a postcard with your next transmission date.

## 2019-10-15 ENCOUNTER — Ambulatory Visit (INDEPENDENT_AMBULATORY_CARE_PROVIDER_SITE_OTHER): Payer: Medicare Other | Admitting: *Deleted

## 2019-10-15 DIAGNOSIS — I442 Atrioventricular block, complete: Secondary | ICD-10-CM

## 2019-10-15 LAB — CUP PACEART REMOTE DEVICE CHECK
Battery Remaining Longevity: 127 mo
Battery Remaining Percentage: 95.5 %
Battery Voltage: 2.99 V
Brady Statistic AP VP Percent: 73 %
Brady Statistic AP VS Percent: 1 %
Brady Statistic AS VP Percent: 27 %
Brady Statistic AS VS Percent: 1 %
Brady Statistic RA Percent Paced: 73 %
Brady Statistic RV Percent Paced: 99 %
Date Time Interrogation Session: 20210407020015
Implantable Lead Implant Date: 20191008
Implantable Lead Implant Date: 20191008
Implantable Lead Location: 753859
Implantable Lead Location: 753860
Implantable Pulse Generator Implant Date: 20191008
Lead Channel Impedance Value: 510 Ohm
Lead Channel Impedance Value: 590 Ohm
Lead Channel Pacing Threshold Amplitude: 0.5 V
Lead Channel Pacing Threshold Amplitude: 0.5 V
Lead Channel Pacing Threshold Pulse Width: 0.5 ms
Lead Channel Pacing Threshold Pulse Width: 0.5 ms
Lead Channel Sensing Intrinsic Amplitude: 12 mV
Lead Channel Sensing Intrinsic Amplitude: 2 mV
Lead Channel Setting Pacing Amplitude: 0.75 V
Lead Channel Setting Pacing Amplitude: 1.5 V
Lead Channel Setting Pacing Pulse Width: 0.5 ms
Lead Channel Setting Sensing Sensitivity: 2 mV
Pulse Gen Model: 2272
Pulse Gen Serial Number: 9068789

## 2019-10-15 NOTE — Progress Notes (Signed)
PPM Remote  

## 2020-01-14 ENCOUNTER — Ambulatory Visit (INDEPENDENT_AMBULATORY_CARE_PROVIDER_SITE_OTHER): Payer: Medicare Other | Admitting: *Deleted

## 2020-01-14 DIAGNOSIS — I442 Atrioventricular block, complete: Secondary | ICD-10-CM | POA: Diagnosis not present

## 2020-01-14 LAB — CUP PACEART REMOTE DEVICE CHECK
Battery Remaining Longevity: 125 mo
Battery Remaining Percentage: 95.5 %
Battery Voltage: 2.99 V
Brady Statistic AP VP Percent: 68 %
Brady Statistic AP VS Percent: 1 %
Brady Statistic AS VP Percent: 32 %
Brady Statistic AS VS Percent: 1 %
Brady Statistic RA Percent Paced: 67 %
Brady Statistic RV Percent Paced: 99 %
Date Time Interrogation Session: 20210707020027
Implantable Lead Implant Date: 20191008
Implantable Lead Implant Date: 20191008
Implantable Lead Location: 753859
Implantable Lead Location: 753860
Implantable Pulse Generator Implant Date: 20191008
Lead Channel Impedance Value: 460 Ohm
Lead Channel Impedance Value: 550 Ohm
Lead Channel Pacing Threshold Amplitude: 0.5 V
Lead Channel Pacing Threshold Amplitude: 0.5 V
Lead Channel Pacing Threshold Pulse Width: 0.5 ms
Lead Channel Pacing Threshold Pulse Width: 0.5 ms
Lead Channel Sensing Intrinsic Amplitude: 12 mV
Lead Channel Sensing Intrinsic Amplitude: 2.2 mV
Lead Channel Setting Pacing Amplitude: 0.75 V
Lead Channel Setting Pacing Amplitude: 1.5 V
Lead Channel Setting Pacing Pulse Width: 0.5 ms
Lead Channel Setting Sensing Sensitivity: 2 mV
Pulse Gen Model: 2272
Pulse Gen Serial Number: 9068789

## 2020-01-16 NOTE — Progress Notes (Signed)
Remote pacemaker transmission.   

## 2020-04-14 ENCOUNTER — Ambulatory Visit (INDEPENDENT_AMBULATORY_CARE_PROVIDER_SITE_OTHER): Payer: Medicare Other

## 2020-04-14 DIAGNOSIS — I442 Atrioventricular block, complete: Secondary | ICD-10-CM | POA: Diagnosis not present

## 2020-04-15 LAB — CUP PACEART REMOTE DEVICE CHECK
Battery Remaining Longevity: 127 mo
Battery Remaining Percentage: 95.5 %
Battery Voltage: 2.99 V
Brady Statistic AP VP Percent: 64 %
Brady Statistic AP VS Percent: 1 %
Brady Statistic AS VP Percent: 36 %
Brady Statistic AS VS Percent: 1 %
Brady Statistic RA Percent Paced: 64 %
Brady Statistic RV Percent Paced: 99 %
Date Time Interrogation Session: 20211006070756
Implantable Lead Implant Date: 20191008
Implantable Lead Implant Date: 20191008
Implantable Lead Location: 753859
Implantable Lead Location: 753860
Implantable Pulse Generator Implant Date: 20191008
Lead Channel Impedance Value: 450 Ohm
Lead Channel Impedance Value: 550 Ohm
Lead Channel Pacing Threshold Amplitude: 0.5 V
Lead Channel Pacing Threshold Amplitude: 0.5 V
Lead Channel Pacing Threshold Pulse Width: 0.5 ms
Lead Channel Pacing Threshold Pulse Width: 0.5 ms
Lead Channel Sensing Intrinsic Amplitude: 12 mV
Lead Channel Sensing Intrinsic Amplitude: 2.6 mV
Lead Channel Setting Pacing Amplitude: 0.75 V
Lead Channel Setting Pacing Amplitude: 1.5 V
Lead Channel Setting Pacing Pulse Width: 0.5 ms
Lead Channel Setting Sensing Sensitivity: 2 mV
Pulse Gen Model: 2272
Pulse Gen Serial Number: 9068789

## 2020-04-19 NOTE — Progress Notes (Signed)
Remote pacemaker transmission.   

## 2020-07-14 ENCOUNTER — Ambulatory Visit (INDEPENDENT_AMBULATORY_CARE_PROVIDER_SITE_OTHER): Payer: Medicare Other

## 2020-07-14 DIAGNOSIS — I442 Atrioventricular block, complete: Secondary | ICD-10-CM | POA: Diagnosis not present

## 2020-07-14 LAB — CUP PACEART REMOTE DEVICE CHECK
Battery Remaining Longevity: 127 mo
Battery Remaining Percentage: 95.5 %
Battery Voltage: 2.99 V
Brady Statistic AP VP Percent: 64 %
Brady Statistic AP VS Percent: 1 %
Brady Statistic AS VP Percent: 36 %
Brady Statistic AS VS Percent: 1 %
Brady Statistic RA Percent Paced: 64 %
Brady Statistic RV Percent Paced: 99 %
Date Time Interrogation Session: 20220105020013
Implantable Lead Implant Date: 20191008
Implantable Lead Implant Date: 20191008
Implantable Lead Location: 753859
Implantable Lead Location: 753860
Implantable Pulse Generator Implant Date: 20191008
Lead Channel Impedance Value: 490 Ohm
Lead Channel Impedance Value: 550 Ohm
Lead Channel Pacing Threshold Amplitude: 0.5 V
Lead Channel Pacing Threshold Amplitude: 0.5 V
Lead Channel Pacing Threshold Pulse Width: 0.5 ms
Lead Channel Pacing Threshold Pulse Width: 0.5 ms
Lead Channel Sensing Intrinsic Amplitude: 12 mV
Lead Channel Sensing Intrinsic Amplitude: 2.3 mV
Lead Channel Setting Pacing Amplitude: 0.75 V
Lead Channel Setting Pacing Amplitude: 1.5 V
Lead Channel Setting Pacing Pulse Width: 0.5 ms
Lead Channel Setting Sensing Sensitivity: 2 mV
Pulse Gen Model: 2272
Pulse Gen Serial Number: 9068789

## 2020-07-28 NOTE — Progress Notes (Signed)
Remote pacemaker transmission.   

## 2020-10-13 ENCOUNTER — Ambulatory Visit (INDEPENDENT_AMBULATORY_CARE_PROVIDER_SITE_OTHER): Payer: Medicare Other

## 2020-10-13 DIAGNOSIS — I442 Atrioventricular block, complete: Secondary | ICD-10-CM

## 2020-10-14 LAB — CUP PACEART REMOTE DEVICE CHECK
Battery Remaining Longevity: 127 mo
Battery Remaining Percentage: 95.5 %
Battery Voltage: 2.99 V
Brady Statistic AP VP Percent: 62 %
Brady Statistic AP VS Percent: 1 %
Brady Statistic AS VP Percent: 38 %
Brady Statistic AS VS Percent: 1 %
Brady Statistic RA Percent Paced: 61 %
Brady Statistic RV Percent Paced: 99 %
Date Time Interrogation Session: 20220406020014
Implantable Lead Implant Date: 20191008
Implantable Lead Implant Date: 20191008
Implantable Lead Location: 753859
Implantable Lead Location: 753860
Implantable Pulse Generator Implant Date: 20191008
Lead Channel Impedance Value: 490 Ohm
Lead Channel Impedance Value: 530 Ohm
Lead Channel Pacing Threshold Amplitude: 0.5 V
Lead Channel Pacing Threshold Amplitude: 0.5 V
Lead Channel Pacing Threshold Pulse Width: 0.5 ms
Lead Channel Pacing Threshold Pulse Width: 0.5 ms
Lead Channel Sensing Intrinsic Amplitude: 12 mV
Lead Channel Sensing Intrinsic Amplitude: 2.1 mV
Lead Channel Setting Pacing Amplitude: 0.75 V
Lead Channel Setting Pacing Amplitude: 1.5 V
Lead Channel Setting Pacing Pulse Width: 0.5 ms
Lead Channel Setting Sensing Sensitivity: 2 mV
Pulse Gen Model: 2272
Pulse Gen Serial Number: 9068789

## 2020-10-25 NOTE — Progress Notes (Signed)
Remote pacemaker transmission.   

## 2021-01-12 ENCOUNTER — Ambulatory Visit (INDEPENDENT_AMBULATORY_CARE_PROVIDER_SITE_OTHER): Payer: Medicare Other

## 2021-01-12 DIAGNOSIS — I442 Atrioventricular block, complete: Secondary | ICD-10-CM | POA: Diagnosis not present

## 2021-01-13 LAB — CUP PACEART REMOTE DEVICE CHECK
Battery Remaining Longevity: 94 mo
Battery Remaining Percentage: 75 %
Battery Voltage: 2.99 V
Brady Statistic AP VP Percent: 61 %
Brady Statistic AP VS Percent: 1 %
Brady Statistic AS VP Percent: 39 %
Brady Statistic AS VS Percent: 1 %
Brady Statistic RA Percent Paced: 60 %
Brady Statistic RV Percent Paced: 99 %
Date Time Interrogation Session: 20220706020013
Implantable Lead Implant Date: 20191008
Implantable Lead Implant Date: 20191008
Implantable Lead Location: 753859
Implantable Lead Location: 753860
Implantable Pulse Generator Implant Date: 20191008
Lead Channel Impedance Value: 490 Ohm
Lead Channel Impedance Value: 550 Ohm
Lead Channel Pacing Threshold Amplitude: 0.5 V
Lead Channel Pacing Threshold Amplitude: 0.5 V
Lead Channel Pacing Threshold Pulse Width: 0.5 ms
Lead Channel Pacing Threshold Pulse Width: 0.5 ms
Lead Channel Sensing Intrinsic Amplitude: 10.2 mV
Lead Channel Sensing Intrinsic Amplitude: 2.5 mV
Lead Channel Setting Pacing Amplitude: 0.75 V
Lead Channel Setting Pacing Amplitude: 1.5 V
Lead Channel Setting Pacing Pulse Width: 0.5 ms
Lead Channel Setting Sensing Sensitivity: 2 mV
Pulse Gen Model: 2272
Pulse Gen Serial Number: 9068789

## 2021-02-02 NOTE — Progress Notes (Signed)
Remote pacemaker transmission.   

## 2021-04-13 ENCOUNTER — Ambulatory Visit (INDEPENDENT_AMBULATORY_CARE_PROVIDER_SITE_OTHER): Payer: Medicare Other

## 2021-04-13 DIAGNOSIS — I442 Atrioventricular block, complete: Secondary | ICD-10-CM

## 2021-04-13 LAB — CUP PACEART REMOTE DEVICE CHECK
Battery Remaining Longevity: 91 mo
Battery Remaining Percentage: 73 %
Battery Voltage: 2.99 V
Brady Statistic AP VP Percent: 58 %
Brady Statistic AP VS Percent: 1 %
Brady Statistic AS VP Percent: 41 %
Brady Statistic AS VS Percent: 1 %
Brady Statistic RA Percent Paced: 58 %
Brady Statistic RV Percent Paced: 99 %
Date Time Interrogation Session: 20221005020012
Implantable Lead Implant Date: 20191008
Implantable Lead Implant Date: 20191008
Implantable Lead Location: 753859
Implantable Lead Location: 753860
Implantable Pulse Generator Implant Date: 20191008
Lead Channel Impedance Value: 490 Ohm
Lead Channel Impedance Value: 550 Ohm
Lead Channel Pacing Threshold Amplitude: 0.375 V
Lead Channel Pacing Threshold Amplitude: 0.5 V
Lead Channel Pacing Threshold Pulse Width: 0.5 ms
Lead Channel Pacing Threshold Pulse Width: 0.5 ms
Lead Channel Sensing Intrinsic Amplitude: 10.8 mV
Lead Channel Sensing Intrinsic Amplitude: 2.7 mV
Lead Channel Setting Pacing Amplitude: 0.75 V
Lead Channel Setting Pacing Amplitude: 1.375
Lead Channel Setting Pacing Pulse Width: 0.5 ms
Lead Channel Setting Sensing Sensitivity: 2 mV
Pulse Gen Model: 2272
Pulse Gen Serial Number: 9068789

## 2021-04-20 DIAGNOSIS — N400 Enlarged prostate without lower urinary tract symptoms: Secondary | ICD-10-CM | POA: Diagnosis present

## 2021-04-21 NOTE — Progress Notes (Signed)
Remote pacemaker transmission.   

## 2021-07-13 ENCOUNTER — Ambulatory Visit (INDEPENDENT_AMBULATORY_CARE_PROVIDER_SITE_OTHER): Payer: Medicare Other

## 2021-07-13 DIAGNOSIS — I442 Atrioventricular block, complete: Secondary | ICD-10-CM

## 2021-07-13 LAB — CUP PACEART REMOTE DEVICE CHECK
Battery Remaining Longevity: 87 mo
Battery Remaining Percentage: 70 %
Battery Voltage: 2.99 V
Brady Statistic AP VP Percent: 58 %
Brady Statistic AP VS Percent: 1 %
Brady Statistic AS VP Percent: 42 %
Brady Statistic AS VS Percent: 1 %
Brady Statistic RA Percent Paced: 58 %
Brady Statistic RV Percent Paced: 99 %
Date Time Interrogation Session: 20230104020015
Implantable Lead Implant Date: 20191008
Implantable Lead Implant Date: 20191008
Implantable Lead Location: 753859
Implantable Lead Location: 753860
Implantable Pulse Generator Implant Date: 20191008
Lead Channel Impedance Value: 490 Ohm
Lead Channel Impedance Value: 530 Ohm
Lead Channel Pacing Threshold Amplitude: 0.5 V
Lead Channel Pacing Threshold Amplitude: 0.625 V
Lead Channel Pacing Threshold Pulse Width: 0.5 ms
Lead Channel Pacing Threshold Pulse Width: 0.5 ms
Lead Channel Sensing Intrinsic Amplitude: 12 mV
Lead Channel Sensing Intrinsic Amplitude: 2.5 mV
Lead Channel Setting Pacing Amplitude: 0.875
Lead Channel Setting Pacing Amplitude: 1.5 V
Lead Channel Setting Pacing Pulse Width: 0.5 ms
Lead Channel Setting Sensing Sensitivity: 2 mV
Pulse Gen Model: 2272
Pulse Gen Serial Number: 9068789

## 2021-07-25 NOTE — Progress Notes (Signed)
Remote pacemaker transmission.   

## 2021-09-26 ENCOUNTER — Inpatient Hospital Stay (HOSPITAL_BASED_OUTPATIENT_CLINIC_OR_DEPARTMENT_OTHER)
Admission: EM | Admit: 2021-09-26 | Discharge: 2021-09-28 | DRG: 683 | Disposition: A | Discharge status: Home / Self Care | Payer: Medicare Other | Source: Other Acute Inpatient Hospital | Attending: Internal Medicine | Admitting: Internal Medicine

## 2021-09-26 ENCOUNTER — Inpatient Hospital Stay (HOSPITAL_COMMUNITY): Payer: Medicare Other

## 2021-09-26 ENCOUNTER — Encounter (HOSPITAL_BASED_OUTPATIENT_CLINIC_OR_DEPARTMENT_OTHER): Payer: Self-pay | Admitting: Emergency Medicine

## 2021-09-26 ENCOUNTER — Other Ambulatory Visit: Payer: Self-pay

## 2021-09-26 DIAGNOSIS — I129 Hypertensive chronic kidney disease with stage 1 through stage 4 chronic kidney disease, or unspecified chronic kidney disease: Secondary | ICD-10-CM | POA: Diagnosis present

## 2021-09-26 DIAGNOSIS — E875 Hyperkalemia: Secondary | ICD-10-CM | POA: Diagnosis present

## 2021-09-26 DIAGNOSIS — Z79899 Other long term (current) drug therapy: Secondary | ICD-10-CM

## 2021-09-26 DIAGNOSIS — N400 Enlarged prostate without lower urinary tract symptoms: Secondary | ICD-10-CM | POA: Diagnosis present

## 2021-09-26 DIAGNOSIS — N1832 Chronic kidney disease, stage 3b: Secondary | ICD-10-CM | POA: Diagnosis present

## 2021-09-26 DIAGNOSIS — D649 Anemia, unspecified: Secondary | ICD-10-CM

## 2021-09-26 DIAGNOSIS — E785 Hyperlipidemia, unspecified: Secondary | ICD-10-CM | POA: Diagnosis present

## 2021-09-26 DIAGNOSIS — Z950 Presence of cardiac pacemaker: Secondary | ICD-10-CM

## 2021-09-26 DIAGNOSIS — D631 Anemia in chronic kidney disease: Secondary | ICD-10-CM | POA: Diagnosis present

## 2021-09-26 DIAGNOSIS — N2581 Secondary hyperparathyroidism of renal origin: Secondary | ICD-10-CM | POA: Diagnosis present

## 2021-09-26 DIAGNOSIS — Z87891 Personal history of nicotine dependence: Secondary | ICD-10-CM

## 2021-09-26 DIAGNOSIS — I442 Atrioventricular block, complete: Secondary | ICD-10-CM | POA: Diagnosis present

## 2021-09-26 DIAGNOSIS — N1831 Chronic kidney disease, stage 3a: Secondary | ICD-10-CM

## 2021-09-26 DIAGNOSIS — I10 Essential (primary) hypertension: Secondary | ICD-10-CM | POA: Diagnosis present

## 2021-09-26 DIAGNOSIS — Z7982 Long term (current) use of aspirin: Secondary | ICD-10-CM | POA: Diagnosis not present

## 2021-09-26 DIAGNOSIS — N179 Acute kidney failure, unspecified: Secondary | ICD-10-CM | POA: Diagnosis present

## 2021-09-26 LAB — CBC
HCT: 38.4 % — ABNORMAL LOW (ref 39.0–52.0)
Hemoglobin: 12.8 g/dL — ABNORMAL LOW (ref 13.0–17.0)
MCH: 29.7 pg (ref 26.0–34.0)
MCHC: 33.3 g/dL (ref 30.0–36.0)
MCV: 89.1 fL (ref 80.0–100.0)
Platelets: 232 10*3/uL (ref 150–400)
RBC: 4.31 MIL/uL (ref 4.22–5.81)
RDW: 13.9 % (ref 11.5–15.5)
WBC: 4.4 10*3/uL (ref 4.0–10.5)
nRBC: 0 % (ref 0.0–0.2)

## 2021-09-26 LAB — URINALYSIS, ROUTINE W REFLEX MICROSCOPIC
Bacteria, UA: NONE SEEN
Bilirubin Urine: NEGATIVE
Glucose, UA: NEGATIVE mg/dL
Hgb urine dipstick: NEGATIVE
Ketones, ur: NEGATIVE mg/dL
Nitrite: NEGATIVE
Protein, ur: NEGATIVE mg/dL
Specific Gravity, Urine: 1.008 (ref 1.005–1.030)
pH: 5 (ref 5.0–8.0)

## 2021-09-26 LAB — I-STAT CHEM 8, ED
BUN: 48 mg/dL — ABNORMAL HIGH (ref 8–23)
Calcium, Ion: 1.34 mmol/L (ref 1.15–1.40)
Chloride: 110 mmol/L (ref 98–111)
Creatinine, Ser: 2.5 mg/dL — ABNORMAL HIGH (ref 0.61–1.24)
Glucose, Bld: 83 mg/dL (ref 70–99)
HCT: 41 % (ref 39.0–52.0)
Hemoglobin: 13.9 g/dL (ref 13.0–17.0)
Potassium: 6.4 mmol/L (ref 3.5–5.1)
Sodium: 137 mmol/L (ref 135–145)
TCO2: 23 mmol/L (ref 22–32)

## 2021-09-26 LAB — GLUCOSE, CAPILLARY
Glucose-Capillary: 107 mg/dL — ABNORMAL HIGH (ref 70–99)
Glucose-Capillary: 134 mg/dL — ABNORMAL HIGH (ref 70–99)
Glucose-Capillary: 65 mg/dL — ABNORMAL LOW (ref 70–99)

## 2021-09-26 LAB — TROPONIN I (HIGH SENSITIVITY)
Troponin I (High Sensitivity): 14 ng/L (ref <18)
Troponin I (High Sensitivity): 14 ng/L (ref <18)

## 2021-09-26 LAB — BASIC METABOLIC PANEL
Anion gap: 7 (ref 5–15)
Anion gap: 7 (ref 5–15)
BUN: 51 mg/dL — ABNORMAL HIGH (ref 8–23)
BUN: 50 mg/dL — ABNORMAL HIGH (ref 8–23)
CO2: 20 mmol/L — ABNORMAL LOW (ref 22–32)
CO2: 19 mmol/L — ABNORMAL LOW (ref 22–32)
Calcium: 9.6 mg/dL (ref 8.9–10.3)
Calcium: 10.1 mg/dL (ref 8.9–10.3)
Chloride: 106 mmol/L (ref 98–111)
Chloride: 108 mmol/L (ref 98–111)
Creatinine, Ser: 2.58 mg/dL — ABNORMAL HIGH (ref 0.61–1.24)
Creatinine, Ser: 2.49 mg/dL — ABNORMAL HIGH (ref 0.61–1.24)
GFR, Estimated: 24 mL/min — ABNORMAL LOW (ref >60)
GFR, Estimated: 25 mL/min — ABNORMAL LOW (ref >60)
Glucose, Bld: 95 mg/dL (ref 70–99)
Glucose, Bld: 87 mg/dL (ref 70–99)
Potassium: 7.2 mmol/L (ref 3.5–5.1)
Potassium: 6.4 mmol/L (ref 3.5–5.1)
Sodium: 133 mmol/L — ABNORMAL LOW (ref 135–145)
Sodium: 134 mmol/L — ABNORMAL LOW (ref 135–145)

## 2021-09-26 LAB — POTASSIUM: Potassium: >7.5 mmol/L (ref 3.5–5.1)

## 2021-09-26 LAB — CBG MONITORING, ED: Glucose-Capillary: 121 mg/dL — ABNORMAL HIGH (ref 70–99)

## 2021-09-26 LAB — SODIUM, URINE, RANDOM: Sodium, Ur: 122 mmol/L

## 2021-09-26 LAB — CREATININE, URINE, RANDOM: Creatinine, Urine: 45.53 mg/dL

## 2021-09-26 MED ORDER — SODIUM CHLORIDE 0.9 % IV BOLUS
500.0000 mL | Freq: Once | INTRAVENOUS | Status: AC
Start: 2021-09-26 — End: 2021-09-26
  Administered 2021-09-26: 500 mL via INTRAVENOUS

## 2021-09-26 MED ORDER — ALBUTEROL SULFATE (2.5 MG/3ML) 0.083% IN NEBU
10.0000 mg | INHALATION_SOLUTION | Freq: Once | RESPIRATORY_TRACT | Status: AC
  Administered 2021-09-26: 10 mg via RESPIRATORY_TRACT
  Filled 2021-09-26: qty 12

## 2021-09-26 MED ORDER — SODIUM BICARBONATE 8.4 % IV SOLN
50.0000 meq | Freq: Once | INTRAVENOUS | Status: AC
  Administered 2021-09-26: 50 meq via INTRAVENOUS
  Filled 2021-09-26: qty 50

## 2021-09-26 MED ORDER — INSULIN ASPART 100 UNIT/ML IJ SOLN
10.0000 [IU] | Freq: Once | INTRAMUSCULAR | Status: AC
  Administered 2021-09-26: 10 [IU] via INTRAVENOUS

## 2021-09-26 MED ORDER — FINASTERIDE 5 MG PO TABS
5.0000 mg | ORAL_TABLET | Freq: Every day | ORAL | Status: DC
  Administered 2021-09-26 – 2021-09-28 (×3): 5 mg via ORAL
  Filled 2021-09-26 (×3): qty 1

## 2021-09-26 MED ORDER — ALBUTEROL SULFATE (2.5 MG/3ML) 0.083% IN NEBU
INHALATION_SOLUTION | RESPIRATORY_TRACT | Status: AC
  Filled 2021-09-26: qty 9

## 2021-09-26 MED ORDER — CALCIUM GLUCONATE 10 % IV SOLN
1.0000 g | Freq: Once | INTRAVENOUS | Status: AC
  Administered 2021-09-26: 1 g via INTRAVENOUS
  Filled 2021-09-26: qty 10

## 2021-09-26 MED ORDER — DEXTROSE 50 % IV SOLN
1.0000 | Freq: Once | INTRAVENOUS | Status: AC
  Administered 2021-09-26: 50 mL via INTRAVENOUS
  Filled 2021-09-26: qty 50

## 2021-09-26 MED ORDER — LABETALOL HCL 100 MG PO TABS
100.0000 mg | ORAL_TABLET | Freq: Two times a day (BID) | ORAL | Status: DC
  Administered 2021-09-26 – 2021-09-27 (×2): 100 mg via ORAL
  Filled 2021-09-26 (×2): qty 1

## 2021-09-26 MED ORDER — SODIUM CHLORIDE 0.9% FLUSH
3.0000 mL | Freq: Two times a day (BID) | INTRAVENOUS | Status: DC
  Administered 2021-09-26 – 2021-09-27 (×2): 3 mL via INTRAVENOUS

## 2021-09-26 MED ORDER — SODIUM ZIRCONIUM CYCLOSILICATE 10 G PO PACK
10.0000 g | PACK | Freq: Two times a day (BID) | ORAL | Status: DC
  Administered 2021-09-26: 10 g via ORAL
  Filled 2021-09-26: qty 1

## 2021-09-26 MED ORDER — ACETAMINOPHEN 325 MG PO TABS: 650.0000 mg | ORAL_TABLET | Freq: Four times a day (QID) | ORAL | Status: DC | PRN

## 2021-09-26 MED ORDER — ACETAMINOPHEN 650 MG RE SUPP: 650.0000 mg | Freq: Four times a day (QID) | RECTAL | Status: DC | PRN

## 2021-09-26 MED ORDER — HEPARIN SODIUM (PORCINE) 5000 UNIT/ML IJ SOLN
5000.0000 [IU] | Freq: Three times a day (TID) | INTRAMUSCULAR | Status: DC
  Administered 2021-09-26 – 2021-09-27 (×4): 5000 [IU] via SUBCUTANEOUS
  Filled 2021-09-26 (×5): qty 1

## 2021-09-26 MED ORDER — INSULIN ASPART 100 UNIT/ML IV SOLN
5.0000 [IU] | Freq: Once | INTRAVENOUS | Status: AC
Start: 2021-09-26 — End: 2021-09-26
  Administered 2021-09-26: 5 [IU] via INTRAVENOUS

## 2021-09-26 MED ORDER — SODIUM BICARBONATE 8.4 % IV SOLN
INTRAVENOUS | Status: DC
  Filled 2021-09-26 (×2): qty 1000

## 2021-09-26 MED ORDER — SODIUM CHLORIDE 0.9 % IV SOLN: 250.0000 mL | INTRAVENOUS | Status: DC | PRN

## 2021-09-26 MED ORDER — SODIUM ZIRCONIUM CYCLOSILICATE 10 G PO PACK
10.0000 g | PACK | Freq: Three times a day (TID) | ORAL | Status: DC
  Administered 2021-09-26 – 2021-09-28 (×5): 10 g via ORAL
  Filled 2021-09-26 (×5): qty 1

## 2021-09-26 MED ORDER — SODIUM CHLORIDE 0.9% FLUSH: 3.0000 mL | INTRAVENOUS | Status: DC | PRN

## 2021-09-26 MED ORDER — SODIUM ZIRCONIUM CYCLOSILICATE 10 G PO PACK
10.0000 g | PACK | Freq: Every day | ORAL | Status: DC
  Administered 2021-09-26: 10 g via ORAL
  Filled 2021-09-26: qty 1

## 2021-09-26 MED ORDER — ROSUVASTATIN CALCIUM 20 MG PO TABS
40.0000 mg | ORAL_TABLET | Freq: Every day | ORAL | Status: DC
  Administered 2021-09-26 – 2021-09-27 (×2): 40 mg via ORAL
  Filled 2021-09-26 (×2): qty 2

## 2021-09-26 MED ORDER — SODIUM BICARBONATE 650 MG PO TABS
650.0000 mg | ORAL_TABLET | Freq: Two times a day (BID) | ORAL | Status: DC
  Filled 2021-09-26: qty 1

## 2021-09-26 MED ORDER — FUROSEMIDE 10 MG/ML IJ SOLN
40.0000 mg | Freq: Once | INTRAMUSCULAR | Status: AC
  Administered 2021-09-26: 40 mg via INTRAVENOUS
  Filled 2021-09-26: qty 4

## 2021-09-26 NOTE — Assessment & Plan Note (Addendum)
Secondary to AKI on CKD.  Hyperkalemia significant on presentation with EKG changes.  Initially received temporizing measures.   Nephrology followed the patient during hospitalization.  Patient was initially on bicarb infusion which was discontinued at this time patient will be discharged home on Lokelma 10 mg twice daily.  Communicated with Dr. Thedore Mins nephrology prior to discharge.  Patient will have to check BMP in 1 to 2 days.  Patient follows up with Dr. Rayna Sexton nephrology at [facility].  I communicated with him for blood work in 1 to 2 days. ?

## 2021-09-26 NOTE — Assessment & Plan Note (Addendum)
Likely anemia of chronic kidney disease.  Latest hemoglobin of 12.4 ?

## 2021-09-26 NOTE — Assessment & Plan Note (Addendum)
Baseline creatinine around 1.7-1.8 from previous admission. ARB and amiloride discontinued previously.  Renal ultrasound 09/26/2021 showed enlarged prostate with no hydronephrosis and echogenic kidneys.    Patient was negative balance for 2193 mL.  Creatinine today at 2.3.  Patient will need to follow-up with his primary nephrologist Dr. Rayna Sexton as outpatient.  Communicated with the patient's wife about it as well ?

## 2021-09-26 NOTE — Assessment & Plan Note (Addendum)
Continue Proscar.  Renal ultrasound with significantly enlarged prostate. ?

## 2021-09-26 NOTE — Consult Note (Addendum)
Nephrology Consult  ? ?Assessment/Recommendations:  ? ?AKI on CKD3b ?-could also be related to progressive CKD, Cr not far off as compared to his outpatient labs on 2/15. In the meantime, will be treating his hyperkalemia and will proceed with UA w/ microscopy and renal ultrasound. I do not see a need for IVF at this time given that he has been eating and drinking well and he is overall euvolemic on exam ?-if hyperK is refractory to treatment, then will need to consider HD in the meantime. Discussed this briefly with patient ?-Continue to monitor daily Cr, Dose meds for GFR<15 ?-Maintain MAP>65 for optimal renal perfusion.  ?-Avoid nephrotoxic medications including NSAIDs and iodinated intravenous contrast exposure unless the latter is absolutely indicated.  Preferred narcotic agents for pain control are hydromorphone, fentanyl, and methadone. Morphine should not be used. Avoid Baclofen and avoid oral sodium phosphate and magnesium citrate based laxatives / bowel preps. Continue strict Input and Output monitoring. Will monitor the patient closely with you and intervene or adjust therapy as indicated by changes in clinical status/labs  ? ?Hyperkalemia ?-continue with lokelma 10g BID for now, monitor serial K ?-will also obtain an istat K to ensure accuracy ? ?Hypertension: ?-resume home labetalol  ? ?Anemia due to chronic kidney disease: ?-Transfuse for Hgb<7 g/dL ?-hgb accetable, in the 12's ? ?Recommendations conveyed to primary service.  ? ?Alexander Oconnor ?[facility] ?09/26/2021 ?3:36 PM ? ? ?_____________________________________________________________________________________ ? ? ?History of Present Illness: Alexander Oconnor is a/an 88 y.o. male with a past medical history of CKD, HTN, CHB s/p PPM, BPH, HLD who presents to [facility] from [facility] with hyperkalemia. Was directed to go to the ER via PCP, was found to have hyperkalemia on his physical on Friday, meds were called in for this (patient is not sure of  med name) however patient reports that the pharmacy was not able to fill rx and K worsened therefore was directed to [facility]. Discussed case with [facility] ER and advised them to continue with shifting agents and lokelma. ?Patient reports that he has been in his usual state of health, no changes in health recently. He has been adhering to a low K diet. He also had two aleves last week prior to those aforementioned labs. ?Patient had a similar presentation at [facility] in Oct. HyperK at that time was secondary to amiloride and ARB therefore these were stopped. His parameters did improve throughout his hospital stay and did receive fluids during this admission. ?Called his outpatient nephrologist's office and spoke with staff([facility], Dr. Dimas Aguas). His last set of blood work was on 2/15 which revealed a Na of 137, K 5.4, Cl 104, bicarb 27, BUN 35, Cr 1.98, Gluc 100, eGFR 32. ?Patient denies any fevers, chills, chest pain, SOB, diminished urinary frequency, constipation/diarrhea, cramps, dizziness. ?Patient received lasix, insulin, d50, bicarb 14mq, 500cc of NS, cal gluc, lokelma, and albuterol. ? ? ?Medications:  ?Current Facility-Administered Medications  ?Medication Dose Route Frequency Provider Last Rate Last Admin  ? calcium gluconate inj 10% (1 g) URGENT USE ONLY!  1 g Intravenous Once Nanavati, Ankit, MD      ? sodium bicarbonate injection 50 mEq  50 mEq Intravenous Once NKathrynn Humble Ankit, MD      ? sodium zirconium cyclosilicate (LOKELMA) packet 10 g  10 g Oral Daily MSuella BroadA, PA-C   10 g at 09/26/21 1329  ? ?Current Outpatient Medications  ?Medication Sig Dispense Refill  ? allopurinol (ZYLOPRIM) 100 MG tablet Take 200  mg by mouth daily.  1  ? aspirin EC 325 MG tablet Take 325 mg by mouth as needed (for muscle soreness or pain).    ? cetirizine (ZYRTEC) 10 MG tablet Take 10 mg by mouth daily as needed for allergies or rhinitis.    ? finasteride (PROSCAR) 5 MG tablet Take 5 mg by mouth daily.  5  ? labetalol  (NORMODYNE) 100 MG tablet Take 100 mg by mouth 2 (two) times daily.  3  ? MAGNESIUM PO Take 1 tablet by mouth daily.    ? naproxen sodium (ALEVE) 220 MG tablet Take 220-440 mg by mouth 2 (two) times daily as needed (for muscle soreness or discomfort).     ? rosuvastatin (CRESTOR) 40 MG tablet Take 40 mg by mouth at bedtime.  2  ? spironolactone (ALDACTONE) 50 MG tablet Take 50 mg by mouth daily.  5  ?  ? ?ALLERGIES ?Patient has no known allergies. ? ?MEDICAL HISTORY ?Past Medical History:  ?Diagnosis Date  ? Complete heart block ([place])   ? Hypertension   ?  ? ?SOCIAL HISTORY ?Social History  ? ?Socioeconomic History  ? Marital status: Married  ?  Spouse name: Not on file  ? Number of children: Not on file  ? Years of education: Not on file  ? Highest education level: Not on file  ?Occupational History  ? Not on file  ?Tobacco Use  ? Smoking status: Former  ?  Packs/day: 0.25  ?  Years: 20.00  ?  Pack years: 5.00  ?  Types: Cigarettes  ?  Quit date: 14  ?  Years since quitting: 49.2  ? Smokeless tobacco: Never  ?Substance and Sexual Activity  ? Alcohol use: Not Currently  ? Drug use: Never  ? Sexual activity: Not on file  ?Other Topics Concern  ? Not on file  ?Social History Narrative  ? Not on file  ? ?Social Determinants of Health  ? ?Financial Resource Strain: Not on file  ?Food Insecurity: Not on file  ?Transportation Needs: Not on file  ?Physical Activity: Not on file  ?Stress: Not on file  ?Social Connections: Not on file  ?Intimate Partner Violence: Not on file  ?  ? ?FAMILY HISTORY ?Family History  ?Problem Relation Age of Onset  ? Heart disease Neg Hx   ?  ? ?Review of Systems: ?12 systems reviewed ?Otherwise as per HPI, all other systems reviewed and negative ? ?Physical Exam: ?Vitals:  ? 09/26/21 1400 09/26/21 1512  ?BP: 137/75 139/71  ?Pulse: 60 70  ?Resp: 16 (!) 22  ?Temp:  (!) 97 ?F (36.1 ?C)  ?SpO2: 100% 100%  ? ?Total I/O ?In: 500 [IV [id number]:734] ?Out: 200 [Urine:200] ? ?Intake/Output Summary  (Last 24 hours) at 09/26/2021 1536 ?Last data filed at 09/26/2021 1506 ?Gross per 24 hour  ?Intake 500 ml  ?Output 200 ml  ?Net 300 ml  ? ?General: well-appearing, no acute distress ?HEENT: anicteric sclera, oropharynx clear without lesions ?CV: regular rate, normal rhythm, no murmurs, no gallops, no rubs, no peripheral edema ?Lungs: clear to auscultation bilaterally, normal work of breathing ?Abd: soft, non-tender, non-distended ?Skin: no visible lesions or rashes ?Psych: alert, engaged, appropriate mood and affect ?Musculoskeletal: no obvious deformities ?Neuro: normal speech, no gross focal deficits  ? ?Test Results ?Reviewed ?Lab Results  ?Component Value Date  ? NA 133 (L) 09/26/2021  ? K 7.2 (HH) 09/26/2021  ? CL 106 09/26/2021  ? CO2 20 (L) 09/26/2021  ? BUN 51 (H)  09/26/2021  ? CREATININE 2.58 (H) 09/26/2021  ? CALCIUM 9.6 09/26/2021  ? ALBUMIN 4.0 04/15/2018  ? ? ? ?I have reviewed all relevant outside healthcare records related to the patient's kidney injury.  ? ?

## 2021-09-26 NOTE — ED Notes (Signed)
Pt arrived to ED via [facility] from [facility]. Pt A&Ox4 and in NAD. ?

## 2021-09-26 NOTE — Assessment & Plan Note (Addendum)
Patient was on labetalol as outpatient.  This has been discontinued on discharge due to  possibility of hyperkalemia..  Amlodipine has been added and will be continued on discharge. ?

## 2021-09-26 NOTE — Progress Notes (Signed)
Overnight progress note ? ?Potassium >7.5 on repeat labs. ? ?Discussed with nephrology and recommending continuing medical management including albuterol neb, insulin/dextrose, starting bicarb drip, and changing the frequency of Lokelma from twice daily to 3 times daily.  Stat repeat EKG ordered and if there are acute changes, will give additional calcium gluconate.  Continue to monitor BMP closely. ? ?

## 2021-09-26 NOTE — ED Triage Notes (Signed)
Pt arrives pov, slow gait with cane, reports referral by pcp for elevated k+ after labs for routine physical. Pt denies Cp ?

## 2021-09-26 NOTE — Assessment & Plan Note (Addendum)
Continue statins.  Continue Crestor on discharge. ?

## 2021-09-26 NOTE — ED Notes (Signed)
Pt ambulated to bathroom w/ cane. 

## 2021-09-26 NOTE — H&P (Signed)
?History and Physical  ? ? ?Patient: Alexander Oconnor [id number] DOB: 08/19/1933 ?DOA: 09/26/2021 ?DOS: the patient was seen and examined on 09/26/2021 ?PCP: Colonel Bald, MD  ?Patient coming from:  [facility] ED to ED   - lives with his wife. Uses a cane to get around  ? ? ?Chief Complaint: "abnormal labs"  ? ?HPI: Alexander Oconnor is a 88 y.o. male with medical history significant of  HTN, CHB s/p PPM in 2019, HLD, BPH, CKD who presented to ED after labs obtained by PCP showed hyperkalemia. He otherwise feels fine.  ? ?Recent hospitalization in October 2022 for AKI with hyperkalemia. Peak creatinine of 2.69. d/c creatinine around 1.7-1.8. hyperkalemia at that time thought to be due to his ARB.  ? ? ?He has been feeling good. Denies any fever/chills, vision changes/headaches, chest pain or palpitations, shortness of breath or cough, abdominal pain, N/V/D, dysuria or leg swelling. No issues with urination.  ? ?No smoking or drinking.  ? ?ER Course:  vitals: afebrile, bp: 137/75, HR: 60, RR: 16, oxygen: 100%RA ?Pertinent labs: hgb: 12.8, potassium: 7.2, sodium: 133, BUN: 51, creatinine: 2.58,  ?In ED: nephrology consulted. Given albuterol, novolog/dextrose, 2g calcium gluconate, lasix, sodium bicarb, 500cc bolus  ? ? ?Review of Systems: As mentioned in the history of present illness. All other systems reviewed and are negative. ?Past Medical History:  ?Diagnosis Date  ? Complete heart block ([place])   ? Hypertension   ? ?Past Surgical History:  ?Procedure Laterality Date  ? FRACTURE SURGERY    ? PACEMAKER IMPLANT N/A 04/16/2018  ? SJM Assurity MRI dual chamber PPM implanted by Dr Rayann Heman for CHB  ? ?Social History:  reports that he quit smoking about 49 years ago. His smoking use included cigarettes. He has a 5.00 pack-year smoking history. He has never used smokeless tobacco. He reports that he does not currently use alcohol. He reports that he does not use drugs. ? ?No Known Allergies ? ?Family History  ?Problem  Relation Age of Onset  ? Heart disease Neg Hx   ? ? ?Prior to Admission medications   ?Medication Sig Start Date End Date Taking? Authorizing Provider  ?allopurinol (ZYLOPRIM) 100 MG tablet Take 200 mg by mouth daily. 04/12/18   [provider]  ?aspirin EC 325 MG tablet Take 325 mg by mouth as needed (for muscle soreness or pain).    [provider]  ?cetirizine (ZYRTEC) 10 MG tablet Take 10 mg by mouth daily as needed for allergies or rhinitis.    [provider]  ?finasteride (PROSCAR) 5 MG tablet Take 5 mg by mouth daily. 03/26/18   [provider]  ?labetalol (NORMODYNE) 100 MG tablet Take 100 mg by mouth 2 (two) times daily. 03/15/18   [provider]  ?MAGNESIUM PO Take 1 tablet by mouth daily.    [provider]  ?naproxen sodium (ALEVE) 220 MG tablet Take 220-440 mg by mouth 2 (two) times daily as needed (for muscle soreness or discomfort).     [provider]  ?rosuvastatin (CRESTOR) 40 MG tablet Take 40 mg by mouth at bedtime. 02/18/18   [provider]  ?spironolactone (ALDACTONE) 50 MG tablet Take 50 mg by mouth daily. 03/23/18   [provider]  ? ? ?Physical Exam: ?Vitals:  ? 09/26/21 1345 09/26/21 1400 09/26/21 1512 09/26/21 1750  ?BP: 135/71 137/75 139/71 (!) 144/82  ?Pulse: 63 60 70 71  ?Resp: 12 16 (!) 22 20  ?Temp:   Marland Kitchen)  97 ?F (36.1 ?C) 97.8 ?F (36.6 ?C)  ?TempSrc:   Oral Oral  ?SpO2: 100% 100% 100% 100%  ?Weight:    103 kg  ?Height:    5\' 11"  (1.803 m)  ? ?General:  Appears calm and comfortable and is in NAD ?Eyes:  PERRL, EOMI, normal lids, iris ?ENT:  grossly normal hearing, lips & tongue, mmm; appropriate dentition ?Neck:  no LAD, masses or thyromegaly; no carotid bruits ?Cardiovascular:  RRR, no m/r/g. No LE edema.  ?Respiratory:   CTA bilaterally with no wheezes/rales/rhonchi.  Normal respiratory effort. ?Abdomen:  soft, NT, ND, NABS ?Back:   normal alignment, no CVAT ?Skin:  no rash or induration seen on limited  exam ?Musculoskeletal:  grossly normal tone BUE/BLE, good ROM, no bony abnormality ?Lower extremity:  No LE edema.  Limited foot exam with no ulcerations.  2+ distal pulses. ?Psychiatric:  grossly normal mood and affect, speech fluent and appropriate, AOx3 ?Neurologic:  CN 2-12 grossly intact, moves all extremities in coordinated fashion, sensation intact ? ? ?Radiological Exams on Admission: ?Independently reviewed - see discussion in A/P where applicable ? ?US RENAL ? ?Result Date: 09/26/2021 ?CLINICAL DATA:  Acute kidney injury. EXAM: RENAL / URINARY TRACT ULTRASOUND COMPLETE COMPARISON:  Urinary tract ultrasound 04/20/2021. FINDINGS: Right Kidney: Renal measurements: 11.0 x 5.9 x 6.8 cm = volume: 230 mL. Increased echogenicity with cortical thinning. Multiple peripelvic cysts, no hydronephrosis. There is a 9 x 7 x 8 mm cyst in the right kidney. Left Kidney: Renal measurements: 11.7 x 6.5 x 7.0 cm = volume: 279 mL. Increased echogenicity with cortical thinning. Multiple small cysts are identified measuring up to 1.1 cm. No hydronephrosis. Bladder: Appears normal for degree of bladder distention. Other: Enlarged prostate gland measuring 7.6 x 5.9 x 4.8 cm, 112 mm. IMPRESSION: 1. No hydronephrosis. 2. Echogenic kidneys with cortical thinning likely related to medical renal disease. 3. Small bilateral renal cysts. 4. Markedly enlarged prostate gland. Electronically Signed   By: Ronney Asters M.D.   On: 09/26/2021 16:44   ? ?EKG: Independently reviewed.  NSR with rate 64 paced; nonspecific ST changes with no evidence of acute ischemia. Mildly peaked t waves on original ekg  ? ? ?Labs on Admission: I have personally reviewed the available labs and imaging studies at the time of the admission. ? ?Pertinent labs:   ? hgb: 12.8,  ?potassium: 7.2,  ?sodium: 133,  ?BUN: 51,  ?creatinine: 2.58,  ? ? ?Assessment and Plan: ?* Acute renal failure superimposed on stage 3a chronic kidney disease ([place]) ?88 year old male presenting  with hyperkalemia from PCP office found to have acute on chronic renal failure ?-admit to progressive ?-baseline appears to be around 1.7-1.8 as had similar hospitalization in 04/2021 with AKI and hyperkalemia. ARB and amiloride discontinued at this discharge.  ?-nephrology consulted, appreciate ?-UA with no significant abnormalities  ?-renal US/urine studies/strict I/O ?-hold nephrotoxic drugs  ?-if potassium does not trend down, may need to consider short term HD which nephrology has relayed to him.  ? ?Hyperkalemia ?Secondary to AKI on CKD ?Similar to presentation in October 2022 and his losartan and amiloride was stopped at that time.  ?Given 2g calcium gluconate, novolog+dextrose, albuterol, sodium bicarb in ED ?Repeat potassium pending ?Nephrology consulted ?lokelma ordered ?ekg with mild tented T waves ?Continue telemetry and potassium binders. F/u on repeat potassium and nephrology recs.  ? ?Essential hypertension ?Well controlled ?Continue labetalol.  ? ?Normocytic anemia ?Likely anemia of CKD ?Check iron panel  ?Monitor  ? ?  Hyperlipidemia ?Continue crestor  ? ?BPH (benign prostatic hyperplasia) ?Continue proscar  ?Renal US pending  ? ?Complete heart block s/p PPM ?Stable, continue to monitor  ? ? ? ?Advance Care Planning:   Code Status: Full Code  ? ?Consults: nephrology: Dr. Candiss Norse  ? ?DVT Prophylaxis: heparin  ? ?Family Communication: none. Left VM on his wife's phone: Lennell Flaugher  ? ?Severity of Illness: ?The appropriate patient status for this patient is INPATIENT. Inpatient status is judged to be reasonable and necessary in order to provide the required intensity of service to ensure the patient's safety. The patient's presenting symptoms, physical exam findings, and initial radiographic and laboratory data in the context of their chronic comorbidities is felt to place them at high risk for further clinical deterioration. Furthermore, it is not anticipated that the patient will be medically stable for  discharge from the hospital within 2 midnights of admission.  ? ?* I certify that at the point of admission it is my clinical judgment that the patient will require inpatient hospital care spanning beyond 2 mi

## 2021-09-26 NOTE — ED Provider Notes (Signed)
?[facility] EMERGENC[facility] DEPARTMENT ?Provider Note ? ? ?CSN: [id number] ?Arrival date & time: 09/26/21  [id number] ? ?  ? ?History ? ?Chief Complaint  ?Patient presents with  ? Abnormal Labs  ? ? ?Alexander Oconnor is a 88 y.o. male. ? ?88 year old male with history of hypertension, hyperlipidemia, BPH presents with report of elevated potassium.  Patient went to his doctor on Friday (09/23/2021) for routine physical, was called today and told his potassium was high and to come to the emergency room.  Patient denies any complaints or concerns and states that he is feeling his usual self.  Patient says that he was admitted to the hospital back in October for high potassium, states that his home medications were adjusted and he is currently only taking his labetalol. ? ? ?  ? ?Home Medications ?Prior to Admission medications   ?Medication Sig Start Date End Date Taking? Authorizing Provider  ?allopurinol (ZYLOPRIM) 100 MG tablet Take 200 mg by mouth daily. 04/12/18   [provider]  ?aspirin EC 325 MG tablet Take 325 mg by mouth as needed (for muscle soreness or pain).    [provider]  ?cetirizine (ZYRTEC) 10 MG tablet Take 10 mg by mouth daily as needed for allergies or rhinitis.    [provider]  ?finasteride (PROSCAR) 5 MG tablet Take 5 mg by mouth daily. 03/26/18   [provider]  ?labetalol (NORMODYNE) 100 MG tablet Take 100 mg by mouth 2 (two) times daily. 03/15/18   [provider]  ?MAGNESIUM PO Take 1 tablet by mouth daily.    [provider]  ?naproxen sodium (ALEVE) 220 MG tablet Take 220-440 mg by mouth 2 (two) times daily as needed (for muscle soreness or discomfort).     [provider]  ?rosuvastatin (CRESTOR) 40 MG tablet Take 40 mg by mouth at bedtime. 02/18/18   [provider]  ?spironolactone (ALDACTONE) 50 MG tablet Take 50 mg by mouth daily. 03/23/18   [provider]  ?   ? ?Allergies    ?Patient has no known allergies.    ? ?Review of Systems   ?Review of Systems ?Negative except as per HPI ?Physical Exam ?Updated Vital Signs ?BP 136/71   Pulse 64   Temp 97.6 ?F (36.4 ?C) (Oral)   Resp 17   Ht 5\' 11"  (1.803 m)   Wt 105.2 kg   SpO2 100%   BMI 32.36 kg/m?  ?Physical Exam ?Vitals and nursing note reviewed.  ?Constitutional:   ?   General: He is not in acute distress. ?   Appearance: He is well-developed. He is not diaphoretic.  ?HENT:  ?   Head: Normocephalic and atraumatic.  ?Cardiovascular:  ?   Rate and Rhythm: Normal rate and regular rhythm.  ?   Pulses: Normal pulses.  ?   Heart sounds: Normal heart sounds.  ?Pulmonary:  ?   Effort: Pulmonary effort is normal.  ?   Breath sounds: Normal breath sounds.  ?Abdominal:  ?   Palpations: Abdomen is soft.  ?   Tenderness: There is no abdominal tenderness.  ?Musculoskeletal:  ?   Right lower leg: No edema.  ?   Left lower leg: No edema.  ?Skin: ?   General: Skin is warm and dry.  ?   Findings: No erythema or rash.  ?Neurological:  ?   Mental Status: He is alert and oriented to person, place, and time.  ?Psychiatric:     ?   Behavior:  Behavior normal.  ? ? ?ED Results / Procedures / Treatments   ?Labs ?(all labs ordered are listed, but only abnormal results are displayed) ?Labs Reviewed  ?CBC - Abnormal; Notable for the following components:  ?    Result Value  ? Hemoglobin 12.8 (*)   ? HCT 38.4 (*)   ? All other components within normal limits  ?BASIC METABOLIC PANEL - Abnormal; Notable for the following components:  ? Sodium 133 (*)   ? Potassium 7.2 (*)   ? CO2 20 (*)   ? BUN 51 (*)   ? Creatinine, Ser 2.58 (*)   ? GFR, Estimated 24 (*)   ? All other components within normal limits  ?TROPONIN I (HIGH SENSITIVITY)  ?TROPONIN I (HIGH SENSITIVITY)  ? ? ?EKG ?EKG Interpretation ? ?Date/Time:  Monday September 26 2021 10:05:47 EDT ?Ventricular Rate:  64 ?PR Interval:  210 ?QRS Duration: 158 ?QT Interval:  468 ?QTC Calculation: 482 ?R Axis:   -43 ?Text Interpretation: Atrial-sensed  ventricular-paced rhythm with prolonged AV conduction Abnormal ECG When compared with ECG of 17-Apr-2018 05:44, No significant change since last tracing Confirmed by Gareth Morgan [phone]) on 09/26/2021 10:51:27 AM ? ?Radiology ?No results found. ? ?Procedures ?Marland KitchenCritical Care ?Performed by: Tacy Learn, PA-C ?Authorized by: Tacy Learn, PA-C  ? ?Critical care provider statement:  ?  Critical care time (minutes):  30 ?  Critical care was time spent personally by me on the following activities:  Development of treatment plan with patient or surrogate, discussions with consultants, evaluation of patient's response to treatment, examination of patient, ordering and review of laboratory studies, ordering and review of radiographic studies, ordering and performing treatments and interventions, pulse oximetry, re-evaluation of patient's condition and review of old charts  ? ? ?Medications Ordered in ED ?Medications  ?sodium zirconium cyclosilicate (LOKELMA) packet 10 g (10 g Oral Given 09/26/21 1329)  ?calcium gluconate inj 10% (1 g) URGENT USE ONLY! (1 g Intravenous Given 09/26/21 1334)  ?albuterol (PROVENTIL) (2.5 MG/3ML) 0.083% nebulizer solution 10 mg (10 mg Nebulization Given 09/26/21 1316)  ?furosemide (LASIX) injection 40 mg (40 mg Intravenous Given 09/26/21 1336)  ?sodium chloride 0.9 % bolus 500 mL (500 mLs Intravenous New Bag/Given 09/26/21 1333)  ?insulin aspart (novoLOG) injection 5 Units (5 Units Intravenous Given 09/26/21 1328)  ?  And  ?dextrose 50 % solution 50 mL (50 mLs Intravenous Given 09/26/21 1327)  ? ? ?ED Course/ Medical Decision Making/ A&P ?  ?                        ?Medical Decision Making ?Amount and/or Complexity of Data Reviewed ?Labs: ordered. ? ? ?This patient presents to the ED for concern of elevated potassium from routine physical on 09/23/21, this involves an extensive number of treatment options, and is a complaint that carries with it a high risk of complications and morbidity.  The  differential diagnosis includes but not limited to erroneous lab/hemolyzed sample, hyperkalemia, AKI ? ? ?Co morbidities that complicate the patient evaluation ? ?Heart block, HTN, BPH, hyperlipidemia  ? ? ?Additional history obtained: ? ? ?External records from outside source obtained and reviewed including dc summary from 10/12-15/22 admission to [facility] for AKI, hyperkalemia. Home meds- amiloride and losartan were dc at time of discharge.  ?Wife at bedside ? ? ?Lab Tests: ? ?I Ordered, and personally interpreted labs.  The pertinent results include: Hyperkalemia with potassium of 7.2.  Creatinine is elevated  at 2.58, previous 1.71 on April 23, 2021. ?Initial lab specimens reportedly hemolyzed, this was a repeat metabolic panel. ? ? ? ?Cardiac Monitoring: / EKG: ? ?The patient was maintained on a cardiac monitor.  I personally viewed and interpreted the cardiac monitored which showed an underlying rhythm of: ventricular paced/elevated T waves ? ? ?Consultations Obtained: ? ?I requested consultation with the nephrologist, Dr. Candiss Norse,  and discussed lab and imaging findings as well as pertinent plan - they recommend: medical admit and management, nephrology will follow along. ?Dr. Gustavus Messing, ER attending at [facility], accepts transfer to [facility] ER. ?Call to hospitalist who requests consult to medicine (PCP [facility]) on arrival in the ER. ? ? ?Problem List / ED Course / Critical interventions / Medication management ? ?88 year old male presents with report of elevated potassium after blood was drawn for physical by his PCP 3 days ago.  Unfortunately, PCP records are not available in this EMR.  Patient otherwise denies vomiting, diarrhea, chest pain, abdominal pain or any other complaints or concerns.  States that he was admitted to [facility], those records were reviewed from 8 April 20, 2021 to October 15 admission for hyperkalemia with potassium of 6.8 and AKI.  Patient's  losartan and amiloride were discontinued following that visit, he states at this point he only takes labetalol.  Patient was discharged with a creatinine of 1.7. ?I ordered medication including calcium gluconate, [place]

## 2021-09-26 NOTE — ED Notes (Signed)
Report given to [facility] and [facility] charge nurse ?

## 2021-09-26 NOTE — Progress Notes (Signed)
Plan of Care Note for accepted transfer ? ? ?Patient: Alexander Oconnor MRN: [id number]   DOA: 09/26/2021 ? ?Facility requesting transfer: [facility] ?Requesting Provider: Dr. Dalene Seltzer ?Reason for transfer: hyperkalemia  ?Facility course: 88 y.o with hx of HTN, CHB s/p PPM in 2019, HLD, BPH, CKD who presented to ED after abnormal labs at PCP showing hyperkalemia. Potassium in ED 7.2 with creatinine of 2.58 and BUN of 51 ?Recent hospitalization creatine around 1.7-1.8 after AKI with peak creatine of 2.69.  ? ?Vitals stable. Ekg with ever so slightly peaked t waves. Vpaced.  ?Nephrology consulted and given potassium binders.  ?Will be transferring ED to ED. Will page out on arrival to Ed.  ? ? ?Author: ?Orland Mustard, MD ?09/26/2021 ? ?Check [email or web address] for on-call coverage. ? ?Nursing staff, Please call [facility] Admits & Consults System-Wide number on Amion as soon as patient's arrival, so appropriate admitting provider can evaluate the pt. ?

## 2021-09-26 NOTE — Assessment & Plan Note (Addendum)
No acute issues at this time. 

## 2021-09-27 DIAGNOSIS — E875 Hyperkalemia: Secondary | ICD-10-CM | POA: Diagnosis not present

## 2021-09-27 DIAGNOSIS — I442 Atrioventricular block, complete: Secondary | ICD-10-CM

## 2021-09-27 DIAGNOSIS — E785 Hyperlipidemia, unspecified: Secondary | ICD-10-CM

## 2021-09-27 DIAGNOSIS — D649 Anemia, unspecified: Secondary | ICD-10-CM

## 2021-09-27 DIAGNOSIS — N179 Acute kidney failure, unspecified: Secondary | ICD-10-CM | POA: Diagnosis not present

## 2021-09-27 DIAGNOSIS — N400 Enlarged prostate without lower urinary tract symptoms: Secondary | ICD-10-CM | POA: Diagnosis not present

## 2021-09-27 DIAGNOSIS — I10 Essential (primary) hypertension: Secondary | ICD-10-CM

## 2021-09-27 LAB — POTASSIUM
Potassium: 6 mmol/L — ABNORMAL HIGH (ref 3.5–5.1)
Potassium: 5.5 mmol/L — ABNORMAL HIGH (ref 3.5–5.1)

## 2021-09-27 LAB — BASIC METABOLIC PANEL
Anion gap: 9 (ref 5–15)
BUN: 49 mg/dL — ABNORMAL HIGH (ref 8–23)
CO2: 24 mmol/L (ref 22–32)
Calcium: 10 mg/dL (ref 8.9–10.3)
Chloride: 102 mmol/L (ref 98–111)
Creatinine, Ser: 2.4 mg/dL — ABNORMAL HIGH (ref 0.61–1.24)
GFR, Estimated: 26 mL/min — ABNORMAL LOW (ref >60)
Glucose, Bld: 122 mg/dL — ABNORMAL HIGH (ref 70–99)
Potassium: 5.6 mmol/L — ABNORMAL HIGH (ref 3.5–5.1)
Sodium: 135 mmol/L (ref 135–145)

## 2021-09-27 LAB — CBC
HCT: 37.7 % — ABNORMAL LOW (ref 39.0–52.0)
Hemoglobin: 12.6 g/dL — ABNORMAL LOW (ref 13.0–17.0)
MCH: 30.1 pg (ref 26.0–34.0)
MCHC: 33.4 g/dL (ref 30.0–36.0)
MCV: 90.2 fL (ref 80.0–100.0)
Platelets: 230 10*3/uL (ref 150–400)
RBC: 4.18 MIL/uL — ABNORMAL LOW (ref 4.22–5.81)
RDW: 13.9 % (ref 11.5–15.5)
WBC: 6 10*3/uL (ref 4.0–10.5)
nRBC: 0 % (ref 0.0–0.2)

## 2021-09-27 MED ORDER — AMLODIPINE BESYLATE 5 MG PO TABS: 5.0000 mg | ORAL_TABLET | Freq: Every day | ORAL | Status: DC

## 2021-09-27 MED ORDER — AMLODIPINE BESYLATE 5 MG PO TABS
5.0000 mg | ORAL_TABLET | Freq: Every day | ORAL | Status: DC
Start: 2021-09-28 — End: 2021-09-28
  Administered 2021-09-28: 5 mg via ORAL
  Filled 2021-09-27: qty 1

## 2021-09-27 NOTE — TOC Progression Note (Signed)
Transition of Care (TOC) - Progression Note  ? ? ?Patient Details  ?Name: Alexander Oconnor ?MRN: [id number] ?Date of Birth: 1934-01-14 ? ?Transition of Care (TOC) CM/SW Contact  ?Leone Haven, RN ?Phone Number: ?09/27/2021, 2:09 PM ? ?Clinical Narrative:    ?rom home with wife, acute renal failure, conts on d5, nephrology consulted. TOC will continue to follow for dc needs.  ? ? ?  ?  ? ?Expected Discharge Plan and Services ?  ?  ?  ?  ?  ?                ?  ?  ?  ?  ?  ?  ?  ?  ?  ?  ? ? ?Social Determinants of Health (SDOH) Interventions ?  ? ?Readmission Risk Interventions ?No flowsheet data found. ? ?

## 2021-09-27 NOTE — Plan of Care (Signed)

## 2021-09-27 NOTE — Progress Notes (Addendum)
Rechecked potassium resulted in level of 6.0. Paged Thedore Mins MD of Nephrology and Pokhrel MD to make aware. ?

## 2021-09-27 NOTE — Progress Notes (Signed)
?PROGRESS NOTE ? ? ? ?Alexander Oconnor  [id number] DOB: [birth year] DOA: 09/26/2021 ?PCP: Colonel Bald, MD  ? ? ?Brief Narrative:  ?Alexander Oconnor is a 88 y.o. male with past medical history of hypertension, c complete heart block status post pacemaker placement, hyperlipidemia, BPH, and CKD.  Hospital seen by the PCP for hypokalemia.   Recent hospitalization in October 2022 for AKI with hyperkalemia and at that time his creatinine was around 1.7-1.8 at the time of discharge.  In the ED vitals were stable.  Potassium was 7.2 on presentation with a creatinine of 2.5.  Nephrology was consulted from the ED and patient was given albuterol nebulizer, novolog/dextrose, 2g calcium gluconate, lasix, sodium bicarb, 500cc bolus and was admitted hospital for further evaluation and treatment. ?  ?   ? ? Assessment and Plan: ?* Hyperkalemia ?Secondary to AKI on CKD.  Was significant on presentation with EKG changes..  Received temporizing measures.   Nephrology on board.  Continue telemetry monitor.  Patient was on bicarbonate infusion which will be discontinued today as per nephrology.  Low potassium diet has been emphasized to the patient including juices.  As per nephrology we will continue Lokelma 10 g 3 times daily today. ? ?Acute renal failure superimposed on stage 3a chronic kidney disease ([place]) ?Baseline creatinine around 1.7-1.8 from previous admission. ARB and amiloride discontinued previously.  Renal ultrasound 09/26/2021 showed enlarged prostate with no hydronephrosis and echogenic kidneys.  Continue strict intake and output charting. ? ?Essential hypertension ?Continue labetalol.  Blood pressure seems to be stable. ? ?Normocytic anemia ?Likely anemia of chronic kidney disease.  Hemoglobin of 12.6. ? ?Hyperlipidemia ?Continue statins. ? ?BPH (benign prostatic hyperplasia) ?Continue Proscar.  Renal ultrasound with significantly enlarged prostate. ? ?Complete heart block s/p PPM ?No acute issues at this  time. ? ? ? DVT prophylaxis: heparin injection 5,000 Units Start: 09/26/21 1645 ? ? ?Code Status:   ?  Code Status: Full Code ? ?Disposition: Home ?Status is: Inpatient ?Remains inpatient appropriate because: Hyper kalemia ? ? Family Communication: Spoke with the patient's wife at bedside.  ? ?Consultants:  ?Nephrology. ? ?Procedures:  ?None so far ? ?Antimicrobials:  ?None ? ?Anti-infectives (From admission, onward)  ? ? None  ? ?  ? ? ? ?Subjective: ?Today, patient was seen and examined at bedside.  Patient's wife at bedside.  Patient denies any shortness of breath chest pain palpitation dizziness lightheadedness ? ?Objective: ?Vitals:  ? 09/27/21 0342 09/27/21 0400 09/27/21 0500 09/27/21 0821  ?BP: (!) 116/58 120/66  128/62  ?Pulse:    61  ?Resp:      ?Temp: 98 ?F (36.7 ?C)   97.6 ?F (36.4 ?C)  ?TempSrc: Oral   Oral  ?SpO2:    99%  ?Weight:   103.3 kg   ?Height:      ? ? ?Intake/Output Summary (Last 24 hours) at 09/27/2021 1109 ?Last data filed at 09/27/2021 1059 ?Gross per 24 hour  ?Intake 876.65 ml  ?Output 1750 ml  ?Net -873.35 ml  ? ?Filed Weights  ? 09/26/21 0959 09/26/21 1750 09/27/21 0500  ?Weight: 105.2 kg 103 kg 103.3 kg  ? ?Body mass index is 31.76 kg/m?.  ? ?Physical Examination: ? ?General: Obese built, not in obvious distress ?HENT:   No scleral pallor or icterus noted. Oral mucosa is moist.  ?Chest:  Clear breath sounds.  Diminished breath sounds bilaterally. No crackles or wheezes.  ?CVS: S1 &S2 heard. No murmur.  Regular rate and rhythm. ?Abdomen: Soft, nontender,  nondistended.  Bowel sounds are heard.   ?Extremities: No cyanosis, clubbing or edema.  Peripheral pulses are palpable. ?Psych: Alert, awake and oriented, normal mood ?CNS:  No cranial nerve deficits.  Power equal in all extremities.   ?Skin: Warm and dry.  No rashes noted. ? ?Data Reviewed:  ? ?CBC: ?Recent Labs  ?Lab 09/26/21 ?1036 09/26/21 ?1611 09/27/21 ?[id number]  ?WBC 4.4  --  6.0  ?HGB 12.8* 13.9 12.6*  ?HCT 38.4* 41.0 37.7*  ?MCV 89.1   --  90.2  ?PLT 232  --  230  ? ? ?Basic Metabolic Panel: ?Recent Labs  ?Lab 09/26/21 ?1211 09/26/21 ?1544 09/26/21 ?1611 09/26/21 ?1904 09/27/21 ?[id number]  ?NA 133* 134* 137  --  135  ?K 7.2* 6.4* 6.4* >7.5* 5.6*  ?CL 106 108 110  --  102  ?CO2 20* 19*  --   --  24  ?GLUCOSE 95 87 83  --  122*  ?BUN 51* 50* 48*  --  49*  ?CREATININE 2.58* 2.49* 2.50*  --  2.40*  ?CALCIUM 9.6 10.1  --   --  10.0  ? ? ?Liver Function Tests: ?No results for input(s): AST, ALT, ALKPHOS, BILITOT, PROT, ALBUMIN in the last 168 hours. ? ? ?Radiology Studies: ?US RENAL ? ?Result Date: 09/26/2021 ?CLINICAL DATA:  Acute kidney injury. EXAM: RENAL / URINARY TRACT ULTRASOUND COMPLETE COMPARISON:  Urinary tract ultrasound 04/20/2021. FINDINGS: Right Kidney: Renal measurements: 11.0 x 5.9 x 6.8 cm = volume: 230 mL. Increased echogenicity with cortical thinning. Multiple peripelvic cysts, no hydronephrosis. There is a 9 x 7 x 8 mm cyst in the right kidney. Left Kidney: Renal measurements: 11.7 x 6.5 x 7.0 cm = volume: 279 mL. Increased echogenicity with cortical thinning. Multiple small cysts are identified measuring up to 1.1 cm. No hydronephrosis. Bladder: Appears normal for degree of bladder distention. Other: Enlarged prostate gland measuring 7.6 x 5.9 x 4.8 cm, 112 mm. IMPRESSION: 1. No hydronephrosis. 2. Echogenic kidneys with cortical thinning likely related to medical renal disease. 3. Small bilateral renal cysts. 4. Markedly enlarged prostate gland. Electronically Signed   By: Ronney Asters M.D.   On: 09/26/2021 16:44   ? ? ? LOS: 1 day  ? ? ?Flora Lipps, MD ?[facility] ?09/27/2021, 11:09 AM  ? ? ?

## 2021-09-27 NOTE — Progress Notes (Signed)
Before the administration of new order dextrose and insulin patient CBG is 134. Then recheck CBG, pt drops to 65 offered cranberry juice. Pt CBG 107 at 2349. ?

## 2021-09-27 NOTE — Hospital Course (Addendum)
Alexander Oconnor is a 88 y.o. male with past medical history of hypertension, complete heart block status post pacemaker placement, hyperlipidemia, BPH, and CKD was sent by the  PCP for hyperkalemia.   Recent hospitalization in October 2022 for AKI with hyperkalemia and at that time his creatinine was around 1.7-1.8 at the time of discharge.  In the ED, vitals were stable.  Potassium was 7.2 on presentation with a creatinine of 2.5.  Nephrology was consulted from the ED and patient was given albuterol nebulizer, novolog/dextrose, 2g calcium gluconate, lasix, sodium bicarb, 500cc bolus and was admitted hospital for further evaluation and treatment. ?  ?  ?

## 2021-09-27 NOTE — Progress Notes (Signed)
?[facility] ?Progress Note  ? ? ?Assessment/ Plan:   ?AKI on CKD3b ?-could also be related to progressive CKD, Cr not far off as compared to his outpatient labs on 2/15 (Cr 1.9, GFR 32). Cr 2.4 today ?-UA w/ hyaline casts, don't think he needs IVFs especially in the context of him eating and drinking okay and is overall euvolemic on exam. Renal ultrasound without obstruction. ?-if hyperK is refractory to treatment, then will need to consider HD in the meantime. Discussed this briefly with patient. Fortunately, this is not the case of right now ?-Advised him to avoid NSAIDs altogether ?-Continue to monitor daily Cr, Dose meds for GFR<15 ?-Maintain MAP>65 for optimal renal perfusion.  ?-Avoid nephrotoxic medications including NSAIDs and iodinated intravenous contrast exposure unless the latter is absolutely indicated.  Preferred narcotic agents for pain control are hydromorphone, fentanyl, and methadone. Morphine should not be used. Avoid Baclofen and avoid oral sodium phosphate and magnesium citrate based laxatives / bowel preps. Continue strict Input and Output monitoring. Will monitor the patient closely with you and intervene or adjust therapy as indicated by changes in clinical status/labs  ?  ?Hyperkalemia ?-will be checking to see if he has RTA4 physiology ?-will stop his bicarb gtt and monitor for nahco3 needs thereafter ?-c/w lokelma 10g TID for today ?-low k diet ?-anticipating that he will need to be discharged on lokelma ?-repeat K at noon today ?  ?Hypertension: ?-resume home labetalol ?-can consider thiazides I.e. chlorthalidone but this can be considered as an outpatient  ?  ?Secondary hyperparathyroidism ?-will check phos and PTH ? ?Anemia due to chronic kidney disease: ?-Transfuse for Hgb<7 g/dL ?-hgb accetable, in the 12's ? ?Subjective:   ?K >7.5 yesterday evening. Started on bicarb gtt, rec'd shifting agents, cal gluc, and lokelma inc'd to TID. K down to 5.6 today. No acute events.  No complaints this AM  ? ?Objective:   ?BP 128/62 (BP Location: Right Arm)   Pulse 61   Temp 97.6 ?F (36.4 ?C) (Oral)   Resp 20   Ht 5\' 11"  (1.803 m)   Wt 103.3 kg   SpO2 99%   BMI 31.76 kg/m?  ? ?Intake/Output Summary (Last 24 hours) at 09/27/2021 0949 ?Last data filed at 09/27/2021 0820 ?Gross per 24 hour  ?Intake 756.65 ml  ?Output 1500 ml  ?Net -743.35 ml  ? ?Weight change:  ? ?Physical Exam: ?Gen:nad ?CVS:rrr ?Resp:normal wob ?09/29/2021, ND ?Ext:no sig edema ?Neuro: awake, alert ? ?Imaging: ?AXK:PVVZ RENAL ? ?Result Date: 09/26/2021 ?CLINICAL DATA:  Acute kidney injury. EXAM: RENAL / URINARY TRACT ULTRASOUND COMPLETE COMPARISON:  Urinary tract ultrasound 04/20/2021. FINDINGS: Right Kidney: Renal measurements: 11.0 x 5.9 x 6.8 cm = volume: 230 mL. Increased echogenicity with cortical thinning. Multiple peripelvic cysts, no hydronephrosis. There is a 9 x 7 x 8 mm cyst in the right kidney. Left Kidney: Renal measurements: 11.7 x 6.5 x 7.0 cm = volume: 279 mL. Increased echogenicity with cortical thinning. Multiple small cysts are identified measuring up to 1.1 cm. No hydronephrosis. Bladder: Appears normal for degree of bladder distention. Other: Enlarged prostate gland measuring 7.6 x 5.9 x 4.8 cm, 112 mm. IMPRESSION: 1. No hydronephrosis. 2. Echogenic kidneys with cortical thinning likely related to medical renal disease. 3. Small bilateral renal cysts. 4. Markedly enlarged prostate gland. Electronically Signed   By: 06/20/2021 M.D.   On: 09/26/2021 16:44   ? ?Labs: ?BMET ?Recent Labs  ?Lab 09/26/21 ?1211 09/26/21 ?1544 09/26/21 ?1611 09/26/21 ?1904 09/27/21 ?09/29/21  ?  NA 133* 134* 137  --  135  ?K 7.2* 6.4* 6.4* >7.5* 5.6*  ?CL 106 108 110  --  102  ?CO2 20* 19*  --   --  24  ?GLUCOSE 95 87 83  --  122*  ?BUN 51* 50* 48*  --  49*  ?CREATININE 2.58* 2.49* 2.50*  --  2.40*  ?CALCIUM 9.6 10.1  --   --  10.0  ? ?CBC ?Recent Labs  ?Lab 09/26/21 ?1036 09/26/21 ?1611 09/27/21 ?8341  ?WBC 4.4  --  6.0  ?HGB 12.8* 13.9  12.6*  ?HCT 38.4* 41.0 37.7*  ?MCV 89.1  --  90.2  ?PLT 232  --  230  ? ? ?Medications:   ? ? albuterol      ? finasteride  5 mg Oral Daily  ? heparin  5,000 Units Subcutaneous Q8H  ? labetalol  100 mg Oral BID  ? rosuvastatin  40 mg Oral QHS  ? sodium chloride flush  3 mL Intravenous Q12H  ? sodium zirconium cyclosilicate  10 g Oral TID  ? ? ? ? ?Anthony Sar, MD ?[facility] ?09/27/2021, 9:49 AM  ? ?

## 2021-09-27 NOTE — Treatment Plan (Signed)
Alexander Oconnor remains high-6.0. Will stop his labetalol as nonselective beta blockers can cause this. Will replace with amlodipine 5mg  daily. Continue with lokelma TID for now. Will order a repeat Alexander Oconnor at around 6pm. If Alexander Oconnor remains high or is higher then would recommend shifting measures for now. ? ? , MD ?[facility] ?

## 2021-09-28 ENCOUNTER — Other Ambulatory Visit (HOSPITAL_BASED_OUTPATIENT_CLINIC_OR_DEPARTMENT_OTHER): Payer: Self-pay

## 2021-09-28 DIAGNOSIS — N179 Acute kidney failure, unspecified: Secondary | ICD-10-CM | POA: Diagnosis not present

## 2021-09-28 DIAGNOSIS — N1832 Chronic kidney disease, stage 3b: Secondary | ICD-10-CM

## 2021-09-28 DIAGNOSIS — I442 Atrioventricular block, complete: Secondary | ICD-10-CM | POA: Diagnosis not present

## 2021-09-28 DIAGNOSIS — N400 Enlarged prostate without lower urinary tract symptoms: Secondary | ICD-10-CM | POA: Diagnosis not present

## 2021-09-28 DIAGNOSIS — E875 Hyperkalemia: Secondary | ICD-10-CM | POA: Diagnosis not present

## 2021-09-28 LAB — CBC
HCT: 36.5 % — ABNORMAL LOW (ref 39.0–52.0)
Hemoglobin: 12.4 g/dL — ABNORMAL LOW (ref 13.0–17.0)
MCH: 30 pg (ref 26.0–34.0)
MCHC: 34 g/dL (ref 30.0–36.0)
MCV: 88.4 fL (ref 80.0–100.0)
Platelets: 249 10*3/uL (ref 150–400)
RBC: 4.13 MIL/uL — ABNORMAL LOW (ref 4.22–5.81)
RDW: 13.8 % (ref 11.5–15.5)
WBC: 5 10*3/uL (ref 4.0–10.5)
nRBC: 0 % (ref 0.0–0.2)

## 2021-09-28 LAB — RENAL FUNCTION PANEL
Albumin: 3.7 g/dL (ref 3.5–5.0)
Anion gap: 9 (ref 5–15)
BUN: 45 mg/dL — ABNORMAL HIGH (ref 8–23)
CO2: 25 mmol/L (ref 22–32)
Calcium: 9.5 mg/dL (ref 8.9–10.3)
Chloride: 101 mmol/L (ref 98–111)
Creatinine, Ser: 2.34 mg/dL — ABNORMAL HIGH (ref 0.61–1.24)
GFR, Estimated: 27 mL/min — ABNORMAL LOW (ref >60)
Glucose, Bld: 109 mg/dL — ABNORMAL HIGH (ref 70–99)
Phosphorus: 4.8 mg/dL — ABNORMAL HIGH (ref 2.5–4.6)
Potassium: 5.2 mmol/L — ABNORMAL HIGH (ref 3.5–5.1)
Sodium: 135 mmol/L (ref 135–145)

## 2021-09-28 LAB — POTASSIUM: Potassium: 5.3 mmol/L — ABNORMAL HIGH (ref 3.5–5.1)

## 2021-09-28 LAB — MAGNESIUM: Magnesium: 1.6 mg/dL — ABNORMAL LOW (ref 1.7–2.4)

## 2021-09-28 MED ORDER — SODIUM ZIRCONIUM CYCLOSILICATE 10 G PO PACK
10.0000 g | PACK | Freq: Two times a day (BID) | ORAL | 0 refills | Status: AC
Start: 2021-09-28 — End: 2021-10-28
  Filled 2021-09-28: qty 60, 30d supply, fill #0

## 2021-09-28 MED ORDER — AMLODIPINE BESYLATE 5 MG PO TABS
5.0000 mg | ORAL_TABLET | Freq: Every day | ORAL | 2 refills | Status: DC
  Filled 2021-09-28: qty 30, 30d supply, fill #0

## 2021-09-28 NOTE — Progress Notes (Signed)
Mobility Specialist Progress Note: ? ? 09/28/21 1130  ?Mobility  ?Activity Ambulated with assistance in hallway  ?Level of Assistance Modified independent, requires aide device or extra time  ?Assistive Device Cane  ?Distance Ambulated (ft) 450 ft  ?Activity Response Tolerated well  ?$Mobility charge 1 Mobility  ? ?Pt asx during ambulation. States he is at baseline. Left sitting EOB with all needs met.  ? ?Anna Kincaid ?Acute Rehab ?Phone: [phone] ?Office Phone: [phone] ? ?

## 2021-09-28 NOTE — TOC Transition Note (Addendum)
Transition of Care (TOC) - CM/SW Discharge Note ? ? ?Patient Details  ?Name: Alexander Oconnor ?MRN: [id number] ?Date of Birth: 27-Oct-1933 ? ?Transition of Care (TOC) CM/SW Contact:  ?Leone Haven, RN ?Phone Number: ?09/28/2021, 12:09 PM ? ? ?Clinical Narrative:    ?Patient is for dc today, has no needs. Has transport home via wife, NCM gave patient the 30 day free trial coupon for  lokelma. ? ? ?  ?  ? ? ?Patient Goals and CMS Choice ?  ?  ?  ? ?Discharge Placement ?  ?           ?  ?  ?  ?  ? ?Discharge Plan and Services ?  ?  ?           ?  ?  ?  ?  ?  ?  ?  ?  ?  ?  ? ?Social Determinants of Health (SDOH) Interventions ?  ? ? ?Readmission Risk Interventions ?   ? View : No data to display.  ?  ?  ?  ? ? ? ? ? ?

## 2021-09-28 NOTE — Progress Notes (Signed)
?[facility] ?Progress Note  ? ? ?Assessment/ Plan:   ?AKI on CKD3b ?-could also be related to progressive CKD, Cr not far off as compared to his outpatient labs on 2/15 (Cr 1.9, GFR 32). Cr 2.3 today and stable ?-UA w/ hyaline casts, don't think he needs IVFs especially in the context of him eating and drinking okay and is overall euvolemic on exam. Renal ultrasound without obstruction. ?-if hyperK is refractory to treatment, then will need to consider HD in the meantime. Discussed this briefly with patient on admission. Fortunately, this is not the case of right now ?-Advised him to avoid NSAIDs altogether ?-Continue to monitor daily Cr, Dose meds for GFR<15 ?-Maintain MAP>65 for optimal renal perfusion.  ?-Avoid nephrotoxic medications including NSAIDs and iodinated intravenous contrast exposure unless the latter is absolutely indicated. Preferred narcotic agents for pain control are hydromorphone, fentanyl, and methadone. Morphine should not be used. Avoid Baclofen and avoid oral sodium phosphate and magnesium citrate based laxatives / bowel preps. Continue strict Input and Output monitoring. Will monitor the patient closely with you and intervene or adjust therapy as indicated by changes in clinical status/labs  ?  ?Hyperkalemia ?-stopped labetalol as nonselective beta blockers can cause hyperkalemia ?-k improving. Would likely need lokelma 10g daily on discharge with close follow up with outpatient nephrologist. On low K diet ?-repeat K at 10am--pending. If K acceptable, would be okay with discharge ?  ?Hypertension: ?-changed labetalol to norvasc 5mg  daily as above. BP currently acceptable ?  ?Secondary hyperparathyroidism ?-pth pending, phos acceptable. No need for binders at this time ? ?Anemia due to chronic kidney disease: ?-Transfuse for Hgb<7 g/dL ?-hgb acceptable/at goal, in the 12's ? ?Subjective:   ?No acute events. K down to 5.2 today. No new complaints. As above, changed labetalol  to norvasc yesterday.  ? ?Objective:   ?BP 118/68 (BP Location: Right Arm)   Pulse 71   Temp 98.1 ?F (36.7 ?C) (Oral)   Resp 18   Ht 5\' 11"  (1.803 m)   Wt 103.6 kg   SpO2 97%   BMI 31.85 kg/m?  ? ?Intake/Output Summary (Last 24 hours) at 09/28/2021 1033 ?Last data filed at 09/28/2021 0300 ?Gross per 24 hour  ?Intake 180 ml  ?Output 1550 ml  ?Net -1370 ml  ? ?Weight change: -1.635 kg ? ?Physical Exam: ?Gen:nad ?CVS:rrr ?Resp:normal wob ?09/30/2021, ND ?Ext:no sig edema ?Neuro: awake, alert ? ?Imaging: ?09/30/2021 RENAL ? ?Result Date: 09/26/2021 ?CLINICAL DATA:  Acute kidney injury. EXAM: RENAL / URINARY TRACT ULTRASOUND COMPLETE COMPARISON:  Urinary tract ultrasound 04/20/2021. FINDINGS: Right Kidney: Renal measurements: 11.0 x 5.9 x 6.8 cm = volume: 230 mL. Increased echogenicity with cortical thinning. Multiple peripelvic cysts, no hydronephrosis. There is a 9 x 7 x 8 mm cyst in the right kidney. Left Kidney: Renal measurements: 11.7 x 6.5 x 7.0 cm = volume: 279 mL. Increased echogenicity with cortical thinning. Multiple small cysts are identified measuring up to 1.1 cm. No hydronephrosis. Bladder: Appears normal for degree of bladder distention. Other: Enlarged prostate gland measuring 7.6 x 5.9 x 4.8 cm, 112 mm. IMPRESSION: 1. No hydronephrosis. 2. Echogenic kidneys with cortical thinning likely related to medical renal disease. 3. Small bilateral renal cysts. 4. Markedly enlarged prostate gland. Electronically Signed   By: 09/28/2021 M.D.   On: 09/26/2021 16:44   ? ?Labs: ?BMET ?Recent Labs  ?Lab 09/26/21 ?1211 09/26/21 ?1544 09/26/21 ?1611 09/26/21 ?1904 09/27/21 ?09/28/21 09/27/21 ?1139 09/27/21 ?1802 09/28/21 ?0417  ?NA 133* 134*  137  --  135  --   --  135  ?K 7.2* 6.4* 6.4* >7.5* 5.6* 6.0* 5.5* 5.2*  ?CL 106 108 110  --  102  --   --  101  ?CO2 20* 19*  --   --  24  --   --  25  ?GLUCOSE 95 87 83  --  122*  --   --  109*  ?BUN 51* 50* 48*  --  49*  --   --  45*  ?CREATININE 2.58* 2.49* 2.50*  --  2.40*  --   --   2.34*  ?CALCIUM 9.6 10.1  --   --  10.0  --   --  9.5  ?PHOS  --   --   --   --   --   --   --  4.8*  ? ?CBC ?Recent Labs  ?Lab 09/26/21 ?1036 09/26/21 ?1611 09/27/21 ?4098 09/28/21 ?0417  ?WBC 4.4  --  6.0 5.0  ?HGB 12.8* 13.9 12.6* 12.4*  ?HCT 38.4* 41.0 37.7* 36.5*  ?MCV 89.1  --  90.2 88.4  ?PLT 232  --  230 249  ? ? ?Medications:   ? ? amLODipine  5 mg Oral Daily  ? finasteride  5 mg Oral Daily  ? heparin  5,000 Units Subcutaneous Q8H  ? rosuvastatin  40 mg Oral QHS  ? sodium chloride flush  3 mL Intravenous Q12H  ? sodium zirconium cyclosilicate  10 g Oral TID  ? ? ? ? ?Anthony Sar, MD ?[facility] ?09/28/2021, 10:33 AM  ? ?

## 2021-09-28 NOTE — Discharge Summary (Addendum)
Physician Discharge Summary   Patient: Alexander Oconnor MRN: [id number] DOB: 12-Mar-1934  Admit date:     09/26/2021  Discharge date: 09/28/21  Discharge Physician: Joycelyn Das   PCP: Charlette Caffey, MD   Recommendations at discharge:   Follow-up with Dr. Rayna Sexton nephrology in 1 week.  Check potassium levels in 1 to 2 days.  Discharge Diagnoses: Principal Problem:   Hyperkalemia Active Problems:   Acute renal failure superimposed on stage 3b chronic kidney disease (HCC)   Essential hypertension   Normocytic anemia   Hyperlipidemia   BPH (benign prostatic hyperplasia)   Complete heart block s/p PPM  Resolved Problems:   * No resolved hospital problems. [facility] Course: Alexander Oconnor is a 88 y.o. male with past medical history of hypertension, complete heart block status post pacemaker placement, hyperlipidemia, BPH, and CKD was sent by the  PCP for hyperkalemia.   Recent hospitalization in October 2022 for AKI with hyperkalemia and at that time his creatinine was around 1.7-1.8 at the time of discharge.  In the ED, vitals were stable.  Potassium was 7.2 on presentation with a creatinine of 2.5.  Nephrology was consulted from the ED and patient was given albuterol nebulizer, novolog/dextrose, 2g calcium gluconate, lasix, sodium bicarb, 500cc bolus and was admitted hospital for further evaluation and treatment.      Assessment and Plan: * Hyperkalemia Secondary to AKI on CKD.  Hyperkalemia significant on presentation with EKG changes.  Initially received temporizing measures.   Nephrology followed the patient during hospitalization.  Patient was initially on bicarb infusion which was discontinued at this time patient will be discharged home on Lokelma 10 mg twice daily.  Communicated with Dr. Thedore Mins nephrology prior to discharge.  Patient will have to check BMP in 1 to 2 days.  Patient follows up with Dr. Rayna Sexton nephrology at [facility].  I communicated with him  for blood work in 1 to 2 days.  Acute renal failure superimposed on stage 3b chronic kidney disease (HCC) Baseline creatinine around 1.7-1.8 from previous admission. ARB and amiloride discontinued previously.  Renal ultrasound 09/26/2021 showed enlarged prostate with no hydronephrosis and echogenic kidneys.    Patient was negative balance for 2193 mL.  Creatinine today at 2.3.  Patient will need to follow-up with his primary nephrologist Dr. Rayna Sexton as outpatient.  Communicated with the patient's wife about it as well  Essential hypertension Patient was on labetalol as outpatient.  This has been discontinued on discharge due to  possibility of hyperkalemia..  Amlodipine has been added and will be continued on discharge.  Normocytic anemia Likely anemia of chronic kidney disease.  Latest hemoglobin of 12.4  Hyperlipidemia Continue statins.  Continue Crestor on discharge.  BPH (benign prostatic hyperplasia) Continue Proscar.  Renal ultrasound with significantly enlarged prostate.  Complete heart block s/p PPM No acute issues at this time.   Consultants: Nephrology Procedures performed: None Disposition: Home.  Communicated with the patient's wife at bedside. Diet recommendation:  Discharge Diet Orders (From admission, onward)     Start     Ordered   09/28/21 0000  Diet - low sodium heart healthy       Comments: No fluids juices, avoid potassium rich food.   09/28/21 1158           Cardiac diet DISCHARGE MEDICATION: Allergies as of 09/28/2021   No Known Allergies      Medication List     STOP taking these medications  labetalol 100 MG tablet Commonly known as: NORMODYNE       TAKE these medications    acetaminophen 500 MG tablet Commonly known as: TYLENOL Take 500 mg by mouth every 6 (six) hours as needed for mild pain.   amLODipine 5 MG tablet Commonly known as: NORVASC Take 1 tablet (5 mg total) by mouth daily. Start taking on: September 29, 2021    cetirizine 10 MG tablet Commonly known as: ZYRTEC Take 10 mg by mouth daily as needed for allergies or rhinitis.   cholecalciferol 25 MCG (1000 UNIT) tablet Commonly known as: VITAMIN D3 Take 1,000 Units by mouth daily.   finasteride 5 MG tablet Commonly known as: PROSCAR Take 5 mg by mouth daily.   multivitamin with minerals Tabs tablet Take 1 tablet by mouth daily.   rosuvastatin 40 MG tablet Commonly known as: CRESTOR Take 40 mg by mouth at bedtime.   sodium zirconium cyclosilicate 10 g Pack packet Commonly known as: LOKELMA Take 1 packet (10 g) by mouth 2 (two) times daily.       Subjective Today, patient was seen and examined.  Denies any dizziness lightheadedness fatigue chest pain or any other symptoms   Discharge Exam: Filed Weights   09/26/21 1750 09/27/21 0500 09/28/21 0403  Weight: 103 kg 103.3 kg 103.6 kg      09/28/2021   11:23 AM 09/28/2021    7:23 AM 09/28/2021    4:03 AM  Vitals with BMI  Weight   228 lbs 6 oz  BMI   31.87  Systolic 156 118 161  Diastolic 76 68 63  Pulse 83 71 64    General:  Average built, not in obvious distress HENT:   No scleral pallor or icterus noted. Oral mucosa is moist.  Chest:  Clear breath sounds.  Diminished breath sounds bilaterally. No crackles or wheezes.  CVS: S1 &S2 heard. No murmur.  Regular rate and rhythm. Abdomen: Soft, nontender, nondistended.  Bowel sounds are heard.   Extremities: No cyanosis, clubbing or edema.  Peripheral pulses are palpable. Psych: Alert, awake and oriented, normal mood CNS:  No cranial nerve deficits.  Power equal in all extremities.   Skin: Warm and dry.  No rashes noted.  Condition at discharge: good  The results of significant diagnostics from this hospitalization (including imaging, microbiology, ancillary and laboratory) are listed below for reference.   Imaging Studies: US RENAL  Result Date: 09/26/2021 CLINICAL DATA:  Acute kidney injury. EXAM: RENAL / URINARY TRACT  ULTRASOUND COMPLETE COMPARISON:  Urinary tract ultrasound 04/20/2021. FINDINGS: Right Kidney: Renal measurements: 11.0 x 5.9 x 6.8 cm = volume: 230 mL. Increased echogenicity with cortical thinning. Multiple peripelvic cysts, no hydronephrosis. There is a 9 x 7 x 8 mm cyst in the right kidney. Left Kidney: Renal measurements: 11.7 x 6.5 x 7.0 cm = volume: 279 mL. Increased echogenicity with cortical thinning. Multiple small cysts are identified measuring up to 1.1 cm. No hydronephrosis. Bladder: Appears normal for degree of bladder distention. Other: Enlarged prostate gland measuring 7.6 x 5.9 x 4.8 cm, 112 mm. IMPRESSION: 1. No hydronephrosis. 2. Echogenic kidneys with cortical thinning likely related to medical renal disease. 3. Small bilateral renal cysts. 4. Markedly enlarged prostate gland. Electronically Signed   By: Darliss Cheney M.D.   On: 09/26/2021 16:44    Microbiology: Results for orders placed or performed during the hospital encounter of 04/15/18  Surgical PCR screen     Status: None   Collection Time: 04/16/18 12:06  PM   Specimen: Nasal Mucosa; Nasal Swab  Result Value Ref Range Status   MRSA, PCR NEGATIVE NEGATIVE Final   Staphylococcus aureus NEGATIVE NEGATIVE Final    Comment: (NOTE) The Xpert SA Assay (FDA approved for NASAL specimens in patients 31 years of age and older), is one component of a comprehensive surveillance program. It is not intended to diagnose infection nor to guide or monitor treatment. Performed at [facility], [address] [place], [place] [address]     Labs: CBC: Recent Labs  Lab 09/26/21 1036 09/26/21 1611 09/27/21 0338 09/28/21 0417  WBC 4.4  --  6.0 5.0  HGB 12.8* 13.9 12.6* 12.4*  HCT 38.4* 41.0 37.7* 36.5*  MCV 89.1  --  90.2 88.4  PLT 232  --  230 249   Basic Metabolic Panel: Recent Labs  Lab 09/26/21 1211 09/26/21 1544 09/26/21 1611 09/26/21 1904 09/27/21 0338 09/27/21 1139 09/27/21 1802 09/28/21 0417 09/28/21 1047   NA 133* 134* 137  --  135  --   --  135  --   K 7.2* 6.4* 6.4*   < > 5.6* 6.0* 5.5* 5.2* 5.3*  CL 106 108 110  --  102  --   --  101  --   CO2 20* 19*  --   --  24  --   --  25  --   GLUCOSE 95 87 83  --  122*  --   --  109*  --   BUN 51* 50* 48*  --  49*  --   --  45*  --   CREATININE 2.58* 2.49* 2.50*  --  2.40*  --   --  2.34*  --   CALCIUM 9.6 10.1  --   --  10.0  --   --  9.5  --   MG  --   --   --   --   --   --   --  1.6*  --   PHOS  --   --   --   --   --   --   --  4.8*  --    < > = values in this interval not displayed.   Liver Function Tests: Recent Labs  Lab 09/28/21 0417  ALBUMIN 3.7   CBG: Recent Labs  Lab 09/26/21 1357 09/26/21 2140 09/26/21 2315 09/26/21 2349  GLUCAP 121* 134* 65* 107*    Discharge time spent: greater than 30 minutes.  Signed: Joycelyn Das, MD [facility] 09/28/2021

## 2021-09-29 LAB — PTH, INTACT AND CALCIUM
Calcium, Total (PTH): 9.8 mg/dL (ref 8.6–10.2)
PTH: 40 pg/mL (ref 15–65)

## 2021-10-12 ENCOUNTER — Ambulatory Visit (INDEPENDENT_AMBULATORY_CARE_PROVIDER_SITE_OTHER): Payer: Medicare Other

## 2021-10-12 DIAGNOSIS — I442 Atrioventricular block, complete: Secondary | ICD-10-CM

## 2021-10-12 LAB — CUP PACEART REMOTE DEVICE CHECK
Battery Remaining Longevity: 81 mo
Battery Remaining Percentage: 68 %
Battery Voltage: 2.99 V
Brady Statistic AP VP Percent: 58 %
Brady Statistic AP VS Percent: 1 %
Brady Statistic AS VP Percent: 41 %
Brady Statistic AS VS Percent: 1 %
Brady Statistic RA Percent Paced: 58 %
Brady Statistic RV Percent Paced: 99 %
Date Time Interrogation Session: 20230405020014
Implantable Lead Implant Date: 20191008
Implantable Lead Implant Date: 20191008
Implantable Lead Location: 753859
Implantable Lead Location: 753860
Implantable Pulse Generator Implant Date: 20191008
Lead Channel Impedance Value: 440 Ohm
Lead Channel Impedance Value: 450 Ohm
Lead Channel Pacing Threshold Amplitude: 0.5 V
Lead Channel Pacing Threshold Amplitude: 0.75 V
Lead Channel Pacing Threshold Pulse Width: 0.5 ms
Lead Channel Pacing Threshold Pulse Width: 0.5 ms
Lead Channel Sensing Intrinsic Amplitude: 12 mV
Lead Channel Sensing Intrinsic Amplitude: 4.1 mV
Lead Channel Setting Pacing Amplitude: 1 V
Lead Channel Setting Pacing Amplitude: 1.5 V
Lead Channel Setting Pacing Pulse Width: 0.5 ms
Lead Channel Setting Sensing Sensitivity: 2 mV
Pulse Gen Model: 2272
Pulse Gen Serial Number: 9068789

## 2021-10-27 NOTE — Progress Notes (Signed)
Remote pacemaker transmission.   

## 2022-01-11 ENCOUNTER — Ambulatory Visit (INDEPENDENT_AMBULATORY_CARE_PROVIDER_SITE_OTHER): Payer: Medicare Other

## 2022-01-11 DIAGNOSIS — I442 Atrioventricular block, complete: Secondary | ICD-10-CM

## 2022-01-13 LAB — CUP PACEART REMOTE DEVICE CHECK
Battery Remaining Longevity: 77 mo
Battery Remaining Percentage: 65 %
Battery Voltage: 2.99 V
Brady Statistic AP VP Percent: 58 %
Brady Statistic AP VS Percent: 1 %
Brady Statistic AS VP Percent: 42 %
Brady Statistic AS VS Percent: 1 %
Brady Statistic RA Percent Paced: 57 %
Brady Statistic RV Percent Paced: 99 %
Date Time Interrogation Session: 20230705020024
Implantable Lead Implant Date: 20191008
Implantable Lead Implant Date: 20191008
Implantable Lead Location: 753859
Implantable Lead Location: 753860
Implantable Pulse Generator Implant Date: 20191008
Lead Channel Impedance Value: 440 Ohm
Lead Channel Impedance Value: 450 Ohm
Lead Channel Pacing Threshold Amplitude: 0.5 V
Lead Channel Pacing Threshold Amplitude: 0.75 V
Lead Channel Pacing Threshold Pulse Width: 0.5 ms
Lead Channel Pacing Threshold Pulse Width: 0.5 ms
Lead Channel Sensing Intrinsic Amplitude: 12 mV
Lead Channel Sensing Intrinsic Amplitude: 2.9 mV
Lead Channel Setting Pacing Amplitude: 1 V
Lead Channel Setting Pacing Amplitude: 1.5 V
Lead Channel Setting Pacing Pulse Width: 0.5 ms
Lead Channel Setting Sensing Sensitivity: 2 mV
Pulse Gen Model: 2272
Pulse Gen Serial Number: 9068789

## 2022-02-01 NOTE — Progress Notes (Signed)
Remote pacemaker transmission.   

## 2022-02-24 ENCOUNTER — Encounter: Payer: Self-pay | Admitting: Internal Medicine

## 2022-02-24 ENCOUNTER — Ambulatory Visit (INDEPENDENT_AMBULATORY_CARE_PROVIDER_SITE_OTHER): Payer: Medicare Other | Admitting: Internal Medicine

## 2022-02-24 VITALS — BP 138/68 | HR 68 | Ht 71.0 in | Wt 229.0 lb

## 2022-02-24 DIAGNOSIS — I442 Atrioventricular block, complete: Secondary | ICD-10-CM

## 2022-02-24 DIAGNOSIS — I10 Essential (primary) hypertension: Secondary | ICD-10-CM

## 2022-02-24 LAB — CUP PACEART INCLINIC DEVICE CHECK
Date Time Interrogation Session: 20230818160942
Implantable Lead Implant Date: 20191008
Implantable Lead Implant Date: 20191008
Implantable Lead Location: 753859
Implantable Lead Location: 753860
Implantable Pulse Generator Implant Date: 20191008
Pulse Gen Model: 2272
Pulse Gen Serial Number: 9068789

## 2022-02-24 NOTE — Patient Instructions (Addendum)
Medication Instructions:  Your physician recommends that you continue on your current medications as directed. Please refer to the Current Medication list given to you today.  *If you need a refill on your cardiac medications before your next appointment, please call your pharmacy*  Lab Work: None ordered.  If you have labs (blood work) drawn today and your tests are completely normal, you will receive your results only by: MyChart Message (if you have MyChart) OR A paper copy in the mail If you have any lab test that is abnormal or we need to change your treatment, we will call you to review the results.  Testing/Procedures: None ordered.  Follow-Up:  IN 1 YEAR WITH RENEE URSUY, PA-C.    Remote monitoring is used to monitor your Pacemaker from home. This monitoring reduces the number of office visits required to check your device to one time per year. It allows [place] to keep an eye on the functioning of your device to ensure it is working properly. You are scheduled for a device check from home on 04/12/22. You may send your transmission at any time that day. If you have a wireless device, the transmission will be sent automatically. After your physician reviews your transmission, you will receive a postcard with your next transmission date.  Important Information About Sugar

## 2022-02-24 NOTE — Progress Notes (Signed)
    PCP: Charlette Caffey, MD Primary Cardiologist: [facility] Primary EP:  Dr Johney Frame  Alexander Oconnor is a 88 y.o. male who presents today for routine electrophysiology followup.  Since last being seen in our clinic, the patient reports doing very well.  Today, he denies symptoms of palpitations, chest pain, shortness of breath,  lower extremity edema, dizziness, presyncope, or syncope.  The patient is otherwise without complaint today.   Past Medical History:  Diagnosis Date   Complete heart block (HCC)    Hypertension    Past Surgical History:  Procedure Laterality Date   FRACTURE SURGERY     PACEMAKER IMPLANT N/A 04/16/2018   SJM Assurity MRI dual chamber PPM implanted by Dr Johney Frame for CHB    ROS- all systems are reviewed and negative except as per HPI above  Current Outpatient Medications  Medication Sig Dispense Refill   acetaminophen (TYLENOL) 500 MG tablet Take 500 mg by mouth every 6 (six) hours as needed for mild pain.     allopurinol (ZYLOPRIM) 100 MG tablet Take 100 mg by mouth daily.     amLODipine (NORVASC) 10 MG tablet Take 10 mg by mouth daily.     cetirizine (ZYRTEC) 10 MG tablet Take 10 mg by mouth daily as needed for allergies or rhinitis.     cholecalciferol (VITAMIN D3) 25 MCG (1000 UNIT) tablet Take 1,000 Units by mouth daily.     finasteride (PROSCAR) 5 MG tablet Take 5 mg by mouth daily.  5   hydrochlorothiazide (HYDRODIURIL) 12.5 MG tablet Take 12.5 mg by mouth daily.     Multiple Vitamin (MULTIVITAMIN WITH MINERALS) TABS tablet Take 1 tablet by mouth daily.     rosuvastatin (CRESTOR) 40 MG tablet Take 40 mg by mouth at bedtime.  2   No current facility-administered medications for this visit.    Physical Exam: Vitals:   02/24/22 1022  BP: 138/68  Pulse: 68  SpO2: 98%  Weight: 229 lb (103.9 kg)  Height: 5\' 11"  (1.803 m)    GEN- The patient is well appearing, alert and oriented x 3 today.   Head- normocephalic, atraumatic Eyes-   Sclera clear, conjunctiva pink Ears- hearing intact Oropharynx- clear Lungs- Clear to ausculation bilaterally, normal work of breathing Chest- pacemaker pocket is well healed Heart- Regular rate and rhythm, no murmurs, rubs or gallops, PMI not laterally displaced GI- soft, NT, ND, + BS Extremities- no clubbing, cyanosis, or edema  Pacemaker interrogation- reviewed in detail today,  See PACEART report   Assessment and Plan:  1. Symptomatic complete heart block Normal pacemaker function See Pace Art report Sensitivity (RV) reduced to 28mV today due to episodes of oversensing (far field) noted  on RV lead. he is device dependant today  2. HTN Stable No change required today  Return to see EP APP in a year  4m MD, [facility] 02/24/2022 11:05 AM

## 2022-04-12 ENCOUNTER — Ambulatory Visit (INDEPENDENT_AMBULATORY_CARE_PROVIDER_SITE_OTHER): Payer: Medicare Other

## 2022-04-12 DIAGNOSIS — I442 Atrioventricular block, complete: Secondary | ICD-10-CM | POA: Diagnosis not present

## 2022-04-12 LAB — CUP PACEART REMOTE DEVICE CHECK
Battery Remaining Longevity: 73 mo
Battery Remaining Percentage: 62 %
Battery Voltage: 2.99 V
Brady Statistic AP VP Percent: 65 %
Brady Statistic AP VS Percent: 1 %
Brady Statistic AS VP Percent: 35 %
Brady Statistic AS VS Percent: 1 %
Brady Statistic RA Percent Paced: 65 %
Brady Statistic RV Percent Paced: 99 %
Date Time Interrogation Session: 20231004020012
Implantable Lead Implant Date: 20191008
Implantable Lead Implant Date: 20191008
Implantable Lead Location: 753859
Implantable Lead Location: 753860
Implantable Pulse Generator Implant Date: 20191008
Lead Channel Impedance Value: 450 Ohm
Lead Channel Impedance Value: 450 Ohm
Lead Channel Pacing Threshold Amplitude: 0.5 V
Lead Channel Pacing Threshold Amplitude: 0.75 V
Lead Channel Pacing Threshold Pulse Width: 0.5 ms
Lead Channel Pacing Threshold Pulse Width: 0.5 ms
Lead Channel Sensing Intrinsic Amplitude: 12 mV
Lead Channel Sensing Intrinsic Amplitude: 4.1 mV
Lead Channel Setting Pacing Amplitude: 1 V
Lead Channel Setting Pacing Amplitude: 1.5 V
Lead Channel Setting Pacing Pulse Width: 0.5 ms
Lead Channel Setting Sensing Sensitivity: 5 mV
Pulse Gen Model: 2272
Pulse Gen Serial Number: 9068789

## 2022-04-17 NOTE — Progress Notes (Signed)
Remote pacemaker transmission.   

## 2022-07-12 ENCOUNTER — Ambulatory Visit (INDEPENDENT_AMBULATORY_CARE_PROVIDER_SITE_OTHER): Payer: Medicare Other

## 2022-07-12 DIAGNOSIS — I442 Atrioventricular block, complete: Secondary | ICD-10-CM | POA: Diagnosis not present

## 2022-07-12 LAB — CUP PACEART REMOTE DEVICE CHECK
Battery Remaining Longevity: 71 mo
Battery Remaining Percentage: 59 %
Battery Voltage: 2.98 V
Brady Statistic AP VP Percent: 62 %
Brady Statistic AP VS Percent: 1 %
Brady Statistic AS VP Percent: 38 %
Brady Statistic AS VS Percent: 1 %
Brady Statistic RA Percent Paced: 61 %
Brady Statistic RV Percent Paced: 99 %
Date Time Interrogation Session: 20240103020013
Implantable Lead Connection Status: 753985
Implantable Lead Connection Status: 753985
Implantable Lead Implant Date: 20191008
Implantable Lead Implant Date: 20191008
Implantable Lead Location: 753859
Implantable Lead Location: 753860
Implantable Pulse Generator Implant Date: 20191008
Lead Channel Impedance Value: 450 Ohm
Lead Channel Impedance Value: 450 Ohm
Lead Channel Pacing Threshold Amplitude: 0.375 V
Lead Channel Pacing Threshold Amplitude: 0.625 V
Lead Channel Pacing Threshold Pulse Width: 0.5 ms
Lead Channel Pacing Threshold Pulse Width: 0.5 ms
Lead Channel Sensing Intrinsic Amplitude: 12 mV
Lead Channel Sensing Intrinsic Amplitude: 3.8 mV
Lead Channel Setting Pacing Amplitude: 0.875
Lead Channel Setting Pacing Amplitude: 1.375
Lead Channel Setting Pacing Pulse Width: 0.5 ms
Lead Channel Setting Sensing Sensitivity: 5 mV
Pulse Gen Model: 2272
Pulse Gen Serial Number: 9068789

## 2022-08-04 NOTE — Progress Notes (Signed)
Remote pacemaker transmission.   

## 2022-10-11 ENCOUNTER — Ambulatory Visit (INDEPENDENT_AMBULATORY_CARE_PROVIDER_SITE_OTHER): Payer: Medicare Other

## 2022-10-11 DIAGNOSIS — I442 Atrioventricular block, complete: Secondary | ICD-10-CM

## 2022-10-11 LAB — CUP PACEART REMOTE DEVICE CHECK
Battery Remaining Longevity: 68 mo
Battery Remaining Percentage: 57 %
Battery Voltage: 2.99 V
Brady Statistic AP VP Percent: 59 %
Brady Statistic AP VS Percent: 1 %
Brady Statistic AS VP Percent: 41 %
Brady Statistic AS VS Percent: 1 %
Brady Statistic RA Percent Paced: 58 %
Brady Statistic RV Percent Paced: 99 %
Date Time Interrogation Session: 20240403022937
Implantable Lead Connection Status: 753985
Implantable Lead Connection Status: 753985
Implantable Lead Implant Date: 20191008
Implantable Lead Implant Date: 20191008
Implantable Lead Location: 753859
Implantable Lead Location: 753860
Implantable Pulse Generator Implant Date: 20191008
Lead Channel Impedance Value: 450 Ohm
Lead Channel Impedance Value: 450 Ohm
Lead Channel Pacing Threshold Amplitude: 0.5 V
Lead Channel Pacing Threshold Amplitude: 0.75 V
Lead Channel Pacing Threshold Pulse Width: 0.5 ms
Lead Channel Pacing Threshold Pulse Width: 0.5 ms
Lead Channel Sensing Intrinsic Amplitude: 11.8 mV
Lead Channel Sensing Intrinsic Amplitude: 3.9 mV
Lead Channel Setting Pacing Amplitude: 1 V
Lead Channel Setting Pacing Amplitude: 1.5 V
Lead Channel Setting Pacing Pulse Width: 0.5 ms
Lead Channel Setting Sensing Sensitivity: 5 mV
Pulse Gen Model: 2272
Pulse Gen Serial Number: 9068789

## 2022-11-16 NOTE — Progress Notes (Signed)
Remote pacemaker transmission.   

## 2023-01-10 ENCOUNTER — Ambulatory Visit (INDEPENDENT_AMBULATORY_CARE_PROVIDER_SITE_OTHER): Payer: Medicare Other

## 2023-01-10 DIAGNOSIS — I442 Atrioventricular block, complete: Secondary | ICD-10-CM

## 2023-01-10 LAB — CUP PACEART REMOTE DEVICE CHECK
Battery Remaining Longevity: 65 mo
Battery Remaining Percentage: 54 %
Battery Voltage: 2.98 V
Brady Statistic AP VP Percent: 56 %
Brady Statistic AP VS Percent: 1 %
Brady Statistic AS VP Percent: 44 %
Brady Statistic AS VS Percent: 1 %
Brady Statistic RA Percent Paced: 55 %
Brady Statistic RV Percent Paced: 99 %
Date Time Interrogation Session: 20240703020015
Implantable Lead Connection Status: 753985
Implantable Lead Connection Status: 753985
Implantable Lead Implant Date: 20191008
Implantable Lead Implant Date: 20191008
Implantable Lead Location: 753859
Implantable Lead Location: 753860
Implantable Pulse Generator Implant Date: 20191008
Lead Channel Impedance Value: 450 Ohm
Lead Channel Impedance Value: 490 Ohm
Lead Channel Pacing Threshold Amplitude: 0.5 V
Lead Channel Pacing Threshold Amplitude: 0.75 V
Lead Channel Pacing Threshold Pulse Width: 0.5 ms
Lead Channel Pacing Threshold Pulse Width: 0.5 ms
Lead Channel Sensing Intrinsic Amplitude: 12 mV
Lead Channel Sensing Intrinsic Amplitude: 3.4 mV
Lead Channel Setting Pacing Amplitude: 1 V
Lead Channel Setting Pacing Amplitude: 1.5 V
Lead Channel Setting Pacing Pulse Width: 0.5 ms
Lead Channel Setting Sensing Sensitivity: 5 mV
Pulse Gen Model: 2272
Pulse Gen Serial Number: 9068789

## 2023-02-01 NOTE — Progress Notes (Signed)
Remote pacemaker transmission.   

## 2023-04-11 LAB — CUP PACEART REMOTE DEVICE CHECK
Battery Remaining Longevity: 61 mo
Battery Remaining Percentage: 52 %
Battery Voltage: 2.98 V
Brady Statistic AP VP Percent: 53 %
Brady Statistic AP VS Percent: 1 %
Brady Statistic AS VP Percent: 47 %
Brady Statistic AS VS Percent: 1 %
Brady Statistic RA Percent Paced: 52 %
Brady Statistic RV Percent Paced: 99 %
Date Time Interrogation Session: 20241002020031
Implantable Lead Connection Status: 753985
Implantable Lead Connection Status: 753985
Implantable Lead Implant Date: 20191008
Implantable Lead Implant Date: 20191008
Implantable Lead Location: 753859
Implantable Lead Location: 753860
Implantable Pulse Generator Implant Date: 20191008
Lead Channel Impedance Value: 480 Ohm
Lead Channel Impedance Value: 510 Ohm
Lead Channel Pacing Threshold Amplitude: 0.375 V
Lead Channel Pacing Threshold Amplitude: 0.875 V
Lead Channel Pacing Threshold Pulse Width: 0.5 ms
Lead Channel Pacing Threshold Pulse Width: 0.5 ms
Lead Channel Sensing Intrinsic Amplitude: 11.8 mV
Lead Channel Sensing Intrinsic Amplitude: 3.9 mV
Lead Channel Setting Pacing Amplitude: 1.125
Lead Channel Setting Pacing Amplitude: 1.375
Lead Channel Setting Pacing Pulse Width: 0.5 ms
Lead Channel Setting Sensing Sensitivity: 5 mV
Pulse Gen Model: 2272
Pulse Gen Serial Number: 9068789

## 2023-04-12 ENCOUNTER — Ambulatory Visit (INDEPENDENT_AMBULATORY_CARE_PROVIDER_SITE_OTHER): Payer: Medicare Other

## 2023-04-12 DIAGNOSIS — I442 Atrioventricular block, complete: Secondary | ICD-10-CM

## 2023-04-27 NOTE — Progress Notes (Signed)
Remote pacemaker transmission.   

## 2023-07-13 ENCOUNTER — Ambulatory Visit (INDEPENDENT_AMBULATORY_CARE_PROVIDER_SITE_OTHER): Payer: Medicare Other

## 2023-07-13 DIAGNOSIS — I442 Atrioventricular block, complete: Secondary | ICD-10-CM

## 2023-07-13 LAB — CUP PACEART REMOTE DEVICE CHECK
Battery Remaining Longevity: 58 mo
Battery Remaining Percentage: 49 %
Battery Voltage: 2.98 V
Brady Statistic AP VP Percent: 51 %
Brady Statistic AP VS Percent: 1 %
Brady Statistic AS VP Percent: 49 %
Brady Statistic AS VS Percent: 1 %
Brady Statistic RA Percent Paced: 51 %
Brady Statistic RV Percent Paced: 99 %
Date Time Interrogation Session: 20250103020013
Implantable Lead Connection Status: 753985
Implantable Lead Connection Status: 753985
Implantable Lead Implant Date: 20191008
Implantable Lead Implant Date: 20191008
Implantable Lead Location: 753859
Implantable Lead Location: 753860
Implantable Pulse Generator Implant Date: 20191008
Lead Channel Impedance Value: 430 Ohm
Lead Channel Impedance Value: 450 Ohm
Lead Channel Pacing Threshold Amplitude: 0.5 V
Lead Channel Pacing Threshold Amplitude: 0.625 V
Lead Channel Pacing Threshold Pulse Width: 0.5 ms
Lead Channel Pacing Threshold Pulse Width: 0.5 ms
Lead Channel Sensing Intrinsic Amplitude: 12 mV
Lead Channel Sensing Intrinsic Amplitude: 4.4 mV
Lead Channel Setting Pacing Amplitude: 0.875
Lead Channel Setting Pacing Amplitude: 1.5 V
Lead Channel Setting Pacing Pulse Width: 0.5 ms
Lead Channel Setting Sensing Sensitivity: 5 mV
Pulse Gen Model: 2272
Pulse Gen Serial Number: 9068789

## 2023-08-17 NOTE — Progress Notes (Signed)
 Remote pacemaker transmission.

## 2023-10-13 LAB — CUP PACEART REMOTE DEVICE CHECK
Battery Remaining Longevity: 52 mo
Battery Remaining Percentage: 46 %
Battery Voltage: 2.96 V
Brady Statistic AP VP Percent: 52 %
Brady Statistic AP VS Percent: 1 %
Brady Statistic AS VP Percent: 48 %
Brady Statistic AS VS Percent: 1 %
Brady Statistic RA Percent Paced: 51 %
Brady Statistic RV Percent Paced: 99 %
Date Time Interrogation Session: 20250404020012
Implantable Lead Connection Status: 753985
Implantable Lead Connection Status: 753985
Implantable Lead Implant Date: 20191008
Implantable Lead Implant Date: 20191008
Implantable Lead Location: 753859
Implantable Lead Location: 753860
Implantable Pulse Generator Implant Date: 20191008
Lead Channel Impedance Value: 430 Ohm
Lead Channel Impedance Value: 450 Ohm
Lead Channel Pacing Threshold Amplitude: 0.5 V
Lead Channel Pacing Threshold Amplitude: 1 V
Lead Channel Pacing Threshold Pulse Width: 0.5 ms
Lead Channel Pacing Threshold Pulse Width: 0.5 ms
Lead Channel Sensing Intrinsic Amplitude: 11.1 mV
Lead Channel Sensing Intrinsic Amplitude: 4 mV
Lead Channel Setting Pacing Amplitude: 1.25 V
Lead Channel Setting Pacing Amplitude: 1.5 V
Lead Channel Setting Pacing Pulse Width: 0.5 ms
Lead Channel Setting Sensing Sensitivity: 5 mV
Pulse Gen Model: 2272
Pulse Gen Serial Number: 9068789

## 2023-10-15 ENCOUNTER — Ambulatory Visit: Payer: Medicare Other

## 2023-10-15 DIAGNOSIS — I442 Atrioventricular block, complete: Secondary | ICD-10-CM

## 2023-11-26 NOTE — Progress Notes (Signed)
 Remote pacemaker transmission.

## 2024-01-15 ENCOUNTER — Ambulatory Visit: Payer: Medicare Other

## 2024-01-15 DIAGNOSIS — I442 Atrioventricular block, complete: Secondary | ICD-10-CM

## 2024-01-16 LAB — CUP PACEART REMOTE DEVICE CHECK
Battery Remaining Longevity: 48 mo
Battery Remaining Percentage: 43 %
Battery Voltage: 2.96 V
Brady Statistic AP VP Percent: 52 %
Brady Statistic AP VS Percent: 1 %
Brady Statistic AS VP Percent: 48 %
Brady Statistic AS VS Percent: 1 %
Brady Statistic RA Percent Paced: 51 %
Brady Statistic RV Percent Paced: 99 %
Date Time Interrogation Session: 20250708020015
Implantable Lead Connection Status: 753985
Implantable Lead Connection Status: 753985
Implantable Lead Implant Date: 20191008
Implantable Lead Implant Date: 20191008
Implantable Lead Location: 753859
Implantable Lead Location: 753860
Implantable Pulse Generator Implant Date: 20191008
Lead Channel Impedance Value: 390 Ohm
Lead Channel Impedance Value: 450 Ohm
Lead Channel Pacing Threshold Amplitude: 0.375 V
Lead Channel Pacing Threshold Amplitude: 1 V
Lead Channel Pacing Threshold Pulse Width: 0.5 ms
Lead Channel Pacing Threshold Pulse Width: 0.5 ms
Lead Channel Sensing Intrinsic Amplitude: 11.5 mV
Lead Channel Sensing Intrinsic Amplitude: 3 mV
Lead Channel Setting Pacing Amplitude: 1.25 V
Lead Channel Setting Pacing Amplitude: 1.375
Lead Channel Setting Pacing Pulse Width: 0.5 ms
Lead Channel Setting Sensing Sensitivity: 5 mV
Pulse Gen Model: 2272
Pulse Gen Serial Number: 9068789

## 2024-01-24 ENCOUNTER — Ambulatory Visit: Payer: Self-pay | Admitting: Cardiology

## 2024-04-16 ENCOUNTER — Ambulatory Visit (INDEPENDENT_AMBULATORY_CARE_PROVIDER_SITE_OTHER): Payer: Medicare Other

## 2024-04-16 DIAGNOSIS — I442 Atrioventricular block, complete: Secondary | ICD-10-CM | POA: Diagnosis not present

## 2024-04-17 LAB — CUP PACEART REMOTE DEVICE CHECK
Battery Remaining Longevity: 44 mo
Battery Remaining Percentage: 41 %
Battery Voltage: 2.96 V
Brady Statistic AP VP Percent: 51 %
Brady Statistic AP VS Percent: 1 %
Brady Statistic AS VP Percent: 49 %
Brady Statistic AS VS Percent: 1 %
Brady Statistic RA Percent Paced: 50 %
Brady Statistic RV Percent Paced: 99 %
Date Time Interrogation Session: 20251007020014
Implantable Lead Connection Status: 753985
Implantable Lead Connection Status: 753985
Implantable Lead Implant Date: 20191008
Implantable Lead Implant Date: 20191008
Implantable Lead Location: 753859
Implantable Lead Location: 753860
Implantable Pulse Generator Implant Date: 20191008
Lead Channel Impedance Value: 380 Ohm
Lead Channel Impedance Value: 440 Ohm
Lead Channel Pacing Threshold Amplitude: 0.5 V
Lead Channel Pacing Threshold Amplitude: 1.125 V
Lead Channel Pacing Threshold Pulse Width: 0.5 ms
Lead Channel Pacing Threshold Pulse Width: 0.5 ms
Lead Channel Sensing Intrinsic Amplitude: 12 mV
Lead Channel Sensing Intrinsic Amplitude: 2.2 mV
Lead Channel Setting Pacing Amplitude: 1.375
Lead Channel Setting Pacing Amplitude: 1.5 V
Lead Channel Setting Pacing Pulse Width: 0.5 ms
Lead Channel Setting Sensing Sensitivity: 5 mV
Pulse Gen Model: 2272
Pulse Gen Serial Number: 9068789

## 2024-04-18 ENCOUNTER — Ambulatory Visit: Payer: Self-pay | Admitting: Cardiology

## 2024-04-18 NOTE — Progress Notes (Signed)
 Remote PPM Transmission

## 2024-04-21 NOTE — Progress Notes (Signed)
 Remote PPM Transmission

## 2024-07-16 ENCOUNTER — Ambulatory Visit

## 2024-07-16 DIAGNOSIS — I442 Atrioventricular block, complete: Secondary | ICD-10-CM | POA: Diagnosis not present

## 2024-07-17 ENCOUNTER — Ambulatory Visit: Payer: Self-pay | Admitting: Cardiology

## 2024-07-17 LAB — CUP PACEART REMOTE DEVICE CHECK
Battery Remaining Longevity: 42 mo
Battery Remaining Percentage: 38 %
Battery Voltage: 2.96 V
Brady Statistic AP VP Percent: 50 %
Brady Statistic AP VS Percent: 1 %
Brady Statistic AS VP Percent: 50 %
Brady Statistic AS VS Percent: 1 %
Brady Statistic RA Percent Paced: 49 %
Brady Statistic RV Percent Paced: 99 %
Date Time Interrogation Session: 20260107021427
Implantable Lead Connection Status: 753985
Implantable Lead Connection Status: 753985
Implantable Lead Implant Date: 20191008
Implantable Lead Implant Date: 20191008
Implantable Lead Location: 753859
Implantable Lead Location: 753860
Implantable Pulse Generator Implant Date: 20191008
Lead Channel Impedance Value: 380 Ohm
Lead Channel Impedance Value: 440 Ohm
Lead Channel Pacing Threshold Amplitude: 0.5 V
Lead Channel Pacing Threshold Amplitude: 1.125 V
Lead Channel Pacing Threshold Pulse Width: 0.5 ms
Lead Channel Pacing Threshold Pulse Width: 0.5 ms
Lead Channel Sensing Intrinsic Amplitude: 11.2 mV
Lead Channel Sensing Intrinsic Amplitude: 3.7 mV
Lead Channel Setting Pacing Amplitude: 1.375
Lead Channel Setting Pacing Amplitude: 1.5 V
Lead Channel Setting Pacing Pulse Width: 0.5 ms
Lead Channel Setting Sensing Sensitivity: 5 mV
Pulse Gen Model: 2272
Pulse Gen Serial Number: 9068789

## 2024-07-21 NOTE — Progress Notes (Signed)
 Remote PPM Transmission

## 2024-10-15 ENCOUNTER — Encounter

## 2025-01-14 ENCOUNTER — Encounter

## 2025-04-15 ENCOUNTER — Encounter

## 2025-07-15 ENCOUNTER — Encounter

## 2025-10-14 ENCOUNTER — Encounter
